# Patient Record
Sex: Female | Born: 1975 | Race: Black or African American | Hispanic: No | Marital: Single | Smoking: Never smoker
Health system: Southern US, Community
[De-identification: ages and names within clinical notes are randomized; demographics above are authoritative.]

## PROBLEM LIST (undated history)

## (undated) DIAGNOSIS — K5900 Constipation, unspecified: Secondary | ICD-10-CM

## (undated) DIAGNOSIS — K121 Other forms of stomatitis: Secondary | ICD-10-CM

## (undated) HISTORY — PX: TUBAL LIGATION: SHX77

## (undated) HISTORY — PX: ECTOPIC PREGNANCY SURGERY: SHX613

---

## 2012-08-11 ENCOUNTER — Emergency Department (HOSPITAL_COMMUNITY): Payer: Medicaid Other

## 2012-08-11 ENCOUNTER — Encounter (HOSPITAL_COMMUNITY): Admission: AD | Disposition: A | Discharge status: Home / Self Care | Payer: Self-pay | Source: Ambulatory Visit

## 2012-08-11 ENCOUNTER — Inpatient Hospital Stay: Admit: 2012-08-11 | Payer: Self-pay | Admitting: Obstetrics & Gynecology

## 2012-08-11 ENCOUNTER — Observation Stay (HOSPITAL_COMMUNITY)
Admission: AD | Admit: 2012-08-11 | Discharge: 2012-08-12 | Disposition: A | Discharge status: Home / Self Care | Payer: Medicaid Other | Source: Ambulatory Visit | Attending: Obstetrics & Gynecology | Admitting: Obstetrics & Gynecology

## 2012-08-11 ENCOUNTER — Inpatient Hospital Stay (HOSPITAL_COMMUNITY): Payer: Medicaid Other | Admitting: Anesthesiology

## 2012-08-11 ENCOUNTER — Encounter (HOSPITAL_COMMUNITY): Payer: Self-pay | Admitting: Anesthesiology

## 2012-08-11 DIAGNOSIS — R1032 Left lower quadrant pain: Secondary | ICD-10-CM | POA: Insufficient documentation

## 2012-08-11 DIAGNOSIS — K661 Hemoperitoneum: Secondary | ICD-10-CM | POA: Insufficient documentation

## 2012-08-11 DIAGNOSIS — O00109 Unspecified tubal pregnancy without intrauterine pregnancy: Principal | ICD-10-CM | POA: Insufficient documentation

## 2012-08-11 DIAGNOSIS — K5900 Constipation, unspecified: Secondary | ICD-10-CM

## 2012-08-11 DIAGNOSIS — K625 Hemorrhage of anus and rectum: Secondary | ICD-10-CM | POA: Insufficient documentation

## 2012-08-11 DIAGNOSIS — O009 Unspecified ectopic pregnancy without intrauterine pregnancy: Secondary | ICD-10-CM

## 2012-08-11 DIAGNOSIS — K649 Unspecified hemorrhoids: Secondary | ICD-10-CM

## 2012-08-11 HISTORY — DX: Constipation, unspecified: K59.00

## 2012-08-11 HISTORY — PX: LAPAROSCOPIC UNILATERAL SALPINGECTOMY: SHX5934

## 2012-08-11 HISTORY — DX: Other forms of stomatitis: K12.1

## 2012-08-11 HISTORY — PX: LAPAROSCOPIC LYSIS OF ADHESIONS: SHX5905

## 2012-08-11 HISTORY — PX: LAPAROSCOPY: SHX197

## 2012-08-11 LAB — CBC
HCT: 25.4 % — ABNORMAL LOW (ref 36.0–46.0)
Hemoglobin: 8.2 g/dL — ABNORMAL LOW (ref 12.0–15.0)
MCH: 26.3 pg (ref 26.0–34.0)
MCH: 26.1 pg (ref 26.0–34.0)
Platelets: 280 10*3/uL (ref 150–400)
RBC: 3 MIL/uL — ABNORMAL LOW (ref 3.87–5.11)
RBC: 3.14 MIL/uL — ABNORMAL LOW (ref 3.87–5.11)
WBC: 10 10*3/uL (ref 4.0–10.5)

## 2012-08-11 LAB — POCT I-STAT, CHEM 8
BUN: 10 mg/dL (ref 6–23)
Creatinine, Ser: 0.6 mg/dL (ref 0.50–1.10)
Glucose, Bld: 100 mg/dL — ABNORMAL HIGH (ref 70–99)
Hemoglobin: 10.9 g/dL — ABNORMAL LOW (ref 12.0–15.0)
TCO2: 21 mmol/L (ref 0–100)

## 2012-08-11 LAB — CBC WITH DIFFERENTIAL/PLATELET
Eosinophils Absolute: 0.2 10*3/uL (ref 0.0–0.7)
Eosinophils Relative: 3 % (ref 0–5)
HCT: 29.4 % — ABNORMAL LOW (ref 36.0–46.0)
Hemoglobin: 9.2 g/dL — ABNORMAL LOW (ref 12.0–15.0)
Lymphocytes Relative: 51 % — ABNORMAL HIGH (ref 12–46)
Lymphs Abs: 2.5 10*3/uL (ref 0.7–4.0)
MCH: 25.2 pg — ABNORMAL LOW (ref 26.0–34.0)
MCV: 80.5 fL (ref 78.0–100.0)
Monocytes Absolute: 0.3 10*3/uL (ref 0.1–1.0)
Monocytes Relative: 5 % (ref 3–12)
Platelets: 342 10*3/uL (ref 150–400)
RBC: 3.65 MIL/uL — ABNORMAL LOW (ref 3.87–5.11)

## 2012-08-11 LAB — TYPE AND SCREEN
ABO/RH(D): O POS
Antibody Screen: NEGATIVE

## 2012-08-11 SURGERY — LAPAROSCOPY OPERATIVE
Anesthesia: General | Wound class: Clean Contaminated

## 2012-08-11 MED ORDER — MEPERIDINE HCL 25 MG/ML IJ SOLN
6.2500 mg | INTRAMUSCULAR | Status: DC | PRN
  Administered 2012-08-11 (×2): 6.25 mg via INTRAVENOUS

## 2012-08-11 MED ORDER — MENTHOL 3 MG MT LOZG
1.0000 | LOZENGE | OROMUCOSAL | Status: DC | PRN
  Filled 2012-08-11: qty 9

## 2012-08-11 MED ORDER — SODIUM CHLORIDE 0.9 % IJ SOLN
INTRAMUSCULAR | Status: DC | PRN
  Administered 2012-08-11: 3 mL via INTRAVENOUS

## 2012-08-11 MED ORDER — NEOSTIGMINE METHYLSULFATE 1 MG/ML IJ SOLN
INTRAMUSCULAR | Status: DC | PRN
  Administered 2012-08-11: 4 mg via INTRAVENOUS

## 2012-08-11 MED ORDER — FAMOTIDINE IN NACL 20-0.9 MG/50ML-% IV SOLN
INTRAVENOUS | Status: AC
  Filled 2012-08-11: qty 50

## 2012-08-11 MED ORDER — LIDOCAINE HCL (CARDIAC) 20 MG/ML IV SOLN
INTRAVENOUS | Status: DC | PRN
  Administered 2012-08-11 (×2): 50 mg via INTRAVENOUS

## 2012-08-11 MED ORDER — BUPIVACAINE HCL (PF) 0.25 % IJ SOLN
INTRAMUSCULAR | Status: DC | PRN
  Administered 2012-08-11: 10 mL

## 2012-08-11 MED ORDER — FENTANYL CITRATE 0.05 MG/ML IJ SOLN
25.0000 ug | INTRAMUSCULAR | Status: DC | PRN
  Administered 2012-08-11 (×3): 50 ug via INTRAVENOUS

## 2012-08-11 MED ORDER — PHENYLEPHRINE HCL 10 MG/ML IJ SOLN
INTRAMUSCULAR | Status: DC | PRN
  Administered 2012-08-11 (×4): 40 ug via INTRAVENOUS

## 2012-08-11 MED ORDER — ONDANSETRON HCL 4 MG/2ML IJ SOLN
4.0000 mg | Freq: Four times a day (QID) | INTRAMUSCULAR | Status: DC | PRN
  Administered 2012-08-11: 4 mg via INTRAVENOUS
  Filled 2012-08-11: qty 2

## 2012-08-11 MED ORDER — PROPOFOL 10 MG/ML IV EMUL
INTRAVENOUS | Status: DC | PRN
  Administered 2012-08-11: 160 mg via INTRAVENOUS

## 2012-08-11 MED ORDER — GLYCOPYRROLATE 0.2 MG/ML IJ SOLN
INTRAMUSCULAR | Status: DC | PRN
  Administered 2012-08-11: .6 mg via INTRAVENOUS

## 2012-08-11 MED ORDER — LACTATED RINGERS IV SOLN
INTRAVENOUS | Status: DC
  Administered 2012-08-11: 22:00:00 via INTRAVENOUS

## 2012-08-11 MED ORDER — OXYCODONE-ACETAMINOPHEN 5-325 MG PO TABS
1.0000 | ORAL_TABLET | ORAL | Status: DC | PRN
  Administered 2012-08-11 – 2012-08-12 (×4): 1 via ORAL
  Filled 2012-08-11 (×4): qty 1

## 2012-08-11 MED ORDER — KETOROLAC TROMETHAMINE 30 MG/ML IJ SOLN
INTRAMUSCULAR | Status: DC | PRN
  Administered 2012-08-11: 30 mg via INTRAVENOUS

## 2012-08-11 MED ORDER — HYDROMORPHONE HCL PF 1 MG/ML IJ SOLN
1.0000 mg | Freq: Once | INTRAMUSCULAR | Status: AC
  Administered 2012-08-11: 1 mg via INTRAVENOUS
  Filled 2012-08-11: qty 1

## 2012-08-11 MED ORDER — FENTANYL CITRATE 0.05 MG/ML IJ SOLN
50.0000 ug | Freq: Once | INTRAMUSCULAR | Status: AC
  Administered 2012-08-11: 50 ug via INTRAVENOUS
  Filled 2012-08-11: qty 2

## 2012-08-11 MED ORDER — KETOROLAC TROMETHAMINE 30 MG/ML IJ SOLN: 30.0000 mg | Freq: Four times a day (QID) | INTRAMUSCULAR | Status: DC

## 2012-08-11 MED ORDER — ACETAMINOPHEN 325 MG PO TABS: 650.0000 mg | ORAL_TABLET | ORAL | Status: DC | PRN

## 2012-08-11 MED ORDER — ONDANSETRON HCL 4 MG/2ML IJ SOLN: 4.0000 mg | Freq: Once | INTRAMUSCULAR | Status: DC | PRN

## 2012-08-11 MED ORDER — FENTANYL CITRATE 0.05 MG/ML IJ SOLN
INTRAMUSCULAR | Status: AC
  Filled 2012-08-11: qty 2

## 2012-08-11 MED ORDER — KETOROLAC TROMETHAMINE 30 MG/ML IJ SOLN: 15.0000 mg | Freq: Once | INTRAMUSCULAR | Status: DC | PRN

## 2012-08-11 MED ORDER — DEXAMETHASONE SODIUM PHOSPHATE 10 MG/ML IJ SOLN
INTRAMUSCULAR | Status: DC | PRN
  Administered 2012-08-11: 10 mg via INTRAVENOUS

## 2012-08-11 MED ORDER — KETOROLAC TROMETHAMINE 30 MG/ML IJ SOLN
30.0000 mg | Freq: Four times a day (QID) | INTRAMUSCULAR | Status: DC
  Administered 2012-08-12 (×3): 30 mg via INTRAVENOUS
  Filled 2012-08-11 (×3): qty 1

## 2012-08-11 MED ORDER — ONDANSETRON HCL 4 MG/2ML IJ SOLN
INTRAMUSCULAR | Status: DC | PRN
  Administered 2012-08-11: 4 mg via INTRAVENOUS

## 2012-08-11 MED ORDER — ROCURONIUM BROMIDE 100 MG/10ML IV SOLN
INTRAVENOUS | Status: DC | PRN
  Administered 2012-08-11: 30 mg via INTRAVENOUS
  Administered 2012-08-11: 10 mg via INTRAVENOUS

## 2012-08-11 MED ORDER — ONDANSETRON HCL 4 MG PO TABS: 4.0000 mg | ORAL_TABLET | Freq: Four times a day (QID) | ORAL | Status: DC | PRN

## 2012-08-11 MED ORDER — MIDAZOLAM HCL 5 MG/5ML IJ SOLN
INTRAMUSCULAR | Status: DC | PRN
  Administered 2012-08-11: 2 mg via INTRAVENOUS

## 2012-08-11 MED ORDER — CITRIC ACID-SODIUM CITRATE 334-500 MG/5ML PO SOLN
ORAL | Status: AC
  Filled 2012-08-11: qty 15

## 2012-08-11 MED ORDER — POLYETHYLENE GLYCOL 3350 17 G PO PACK
17.0000 g | PACK | Freq: Two times a day (BID) | ORAL | Status: DC
  Administered 2012-08-12: 17 g via ORAL
  Filled 2012-08-11 (×3): qty 1

## 2012-08-11 MED ORDER — CEFAZOLIN SODIUM-DEXTROSE 2-3 GM-% IV SOLR
2.0000 g | Freq: Once | INTRAVENOUS | Status: AC
  Administered 2012-08-11: 2 g via INTRAVENOUS

## 2012-08-11 MED ORDER — LACTATED RINGERS IV SOLN
INTRAVENOUS | Status: DC | PRN
  Administered 2012-08-11 (×3): via INTRAVENOUS

## 2012-08-11 MED ORDER — LACTATED RINGERS IR SOLN
Status: DC | PRN
  Administered 2012-08-11: 3000 mL

## 2012-08-11 MED ORDER — FAMOTIDINE IN NACL 20-0.9 MG/50ML-% IV SOLN
20.0000 mg | Freq: Once | INTRAVENOUS | Status: AC
  Administered 2012-08-11: 20 mg via INTRAVENOUS

## 2012-08-11 MED ORDER — CITRIC ACID-SODIUM CITRATE 334-500 MG/5ML PO SOLN
30.0000 mL | Freq: Once | ORAL | Status: AC
  Administered 2012-08-11: 30 mL via ORAL

## 2012-08-11 MED ORDER — FENTANYL CITRATE 0.05 MG/ML IJ SOLN
INTRAMUSCULAR | Status: DC | PRN
  Administered 2012-08-11 (×2): 100 ug via INTRAVENOUS

## 2012-08-11 SURGICAL SUPPLY — 30 items
APPLICATOR COTTON TIP 6IN STRL (MISCELLANEOUS) ×3 IMPLANT
BLADE SURG 15 STRL LF C SS BP (BLADE) ×2 IMPLANT
BLADE SURG 15 STRL SS (BLADE) ×1
CHLORAPREP W/TINT 26ML (MISCELLANEOUS) ×3 IMPLANT
CLOTH BEACON ORANGE TIMEOUT ST (SAFETY) ×3 IMPLANT
DERMABOND ADVANCED (GAUZE/BANDAGES/DRESSINGS) ×1
DERMABOND ADVANCED .7 DNX12 (GAUZE/BANDAGES/DRESSINGS) ×2 IMPLANT
GLOVE BIO SURGEON STRL SZ7 (GLOVE) ×3 IMPLANT
GLOVE BIOGEL PI IND STRL 7.0 (GLOVE) ×2 IMPLANT
GLOVE BIOGEL PI INDICATOR 7.0 (GLOVE) ×1
GOWN PREVENTION PLUS LG XLONG (DISPOSABLE) ×6 IMPLANT
HEMOSTAT SURGICEL 4X8 (HEMOSTASIS) ×3 IMPLANT
NEEDLE INSUFFLATION 120MM (ENDOMECHANICALS) IMPLANT
NEEDLE INSUFFLATION 14GA 120MM (NEEDLE) ×3 IMPLANT
NS IRRIG 1000ML POUR BTL (IV SOLUTION) ×3 IMPLANT
PACK LAPAROSCOPY BASIN (CUSTOM PROCEDURE TRAY) ×3 IMPLANT
POUCH SPECIMEN RETRIEVAL 10MM (ENDOMECHANICALS) ×6 IMPLANT
PROTECTOR NERVE ULNAR (MISCELLANEOUS) ×3 IMPLANT
SCALPEL HARMONIC ACE (MISCELLANEOUS) ×3 IMPLANT
SET IRRIG TUBING LAPAROSCOPIC (IRRIGATION / IRRIGATOR) ×3 IMPLANT
SUT VICRYL 0 ENDOLOOP (SUTURE) IMPLANT
SUT VICRYL 0 UR6 27IN ABS (SUTURE) ×9 IMPLANT
SUT VICRYL 4-0 PS2 18IN ABS (SUTURE) ×9 IMPLANT
TOWEL OR 17X24 6PK STRL BLUE (TOWEL DISPOSABLE) ×6 IMPLANT
TRAY FOLEY CATH 14FR (SET/KITS/TRAYS/PACK) ×3 IMPLANT
TROCAR BALLN 12MMX100 BLUNT (TROCAR) IMPLANT
TROCAR XCEL DIL TIP R 11M (ENDOMECHANICALS) ×3 IMPLANT
TROCAR XCEL NON-BLD 11X100MML (ENDOMECHANICALS) ×6 IMPLANT
TROCAR XCEL NON-BLD 5MMX100MML (ENDOMECHANICALS) ×9 IMPLANT
WATER STERILE IRR 1000ML POUR (IV SOLUTION) ×3 IMPLANT

## 2012-08-11 NOTE — Anesthesia Preprocedure Evaluation (Addendum)
Anesthesia Evaluation  Patient identified by MRN, date of birth, ID band Patient awake    Reviewed: Allergy & Precautions, H&P , NPO status , Patient's Chart, lab work & pertinent test results, reviewed documented beta blocker date and time   Airway Mallampati: II TM Distance: >3 FB Neck ROM: full    Dental no notable dental hx. (+) Teeth Intact   Pulmonary neg pulmonary ROS,    Pulmonary exam normal       Cardiovascular negative cardio ROS      Neuro/Psych negative neurological ROS  negative psych ROS   GI/Hepatic negative GI ROS, Neg liver ROS,   Endo/Other  negative endocrine ROS  Renal/GU negative Renal ROS  negative genitourinary   Musculoskeletal negative musculoskeletal ROS (+)   Abdominal Normal abdominal exam  (+)   Peds negative pediatric ROS (+)  Hematology negative hematology ROS (+)   Anesthesia Other Findings   Reproductive/Obstetrics negative OB ROS                          Anesthesia Physical Anesthesia Plan  ASA: II  Anesthesia Plan: General   Post-op Pain Management:    Induction: Intravenous  Airway Management Planned: Oral ETT  Additional Equipment:   Intra-op Plan:   Post-operative Plan: Extubation in OR  Informed Consent: I have reviewed the patients History and Physical, chart, labs and discussed the procedure including the risks, benefits and alternatives for the proposed anesthesia with the patient or authorized representative who has indicated his/her understanding and acceptance.   Dental Advisory Given  Plan Discussed with: CRNA and Surgeon  Anesthesia Plan Comments:         Anesthesia Quick Evaluation

## 2012-08-11 NOTE — ED Provider Notes (Signed)
History     CSN: [id number]  Arrival date & time 08/11/12  1319   First MD Initiated Contact with Patient 08/11/12 1340      Chief Complaint  Patient presents with  . Ectopic Pregnancy    (Consider location/radiation/quality/duration/timing/severity/associated sxs/prior treatment) Patient is a 37 y.o. female presenting with abdominal pain. The history is provided by the patient.  Abdominal Pain Pain location:  LLQ Pain quality: sharp, shooting and stabbing   Pain radiates to:  Does not radiate Pain severity:  Severe Onset quality:  Sudden Duration:  1 hour Timing:  Constant Progression:  Unchanged Chronicity:  New Context comment:  States last week was in the hospital for ectopic pregnancy and given MTX x2 and has been constipated since d/c.  while using the bathroom today she developed severe LLQ pain which is same side as ectopic.   Relieved by:  Nothing Worsened by:  Palpation Ineffective treatments:  None tried Associated symptoms: constipation   Associated symptoms: no anorexia, no fever, no vaginal bleeding, no vaginal discharge and no vomiting   Risk factors: pregnancy   Risk factors comment:  Prior hx of right ectopic with removal of right fallopian tube.  discovered left ectopic last week adn getting mtx.   No past medical history on file.  No past surgical history on file.  No family history on file.  History  Substance Use Topics  . Smoking status: Not on file  . Smokeless tobacco: Not on file  . Alcohol Use: Not on file    OB History   No data available      Review of Systems  Constitutional: Negative for fever.  Gastrointestinal: Positive for abdominal pain and constipation. Negative for vomiting and anorexia.  Genitourinary: Negative for vaginal bleeding and vaginal discharge.  All other systems reviewed and are negative.    Allergies  Reglan  Home Medications  No current outpatient prescriptions on file.  BP 140/84  Pulse 117   Temp(Src) 97.9 F (36.6 C) (Oral)  Resp 18  SpO2 100%  Physical Exam  Nursing note and vitals reviewed. Constitutional: She is oriented to person, place, and time. She appears well-developed and well-nourished. No distress.  HENT:  Head: Normocephalic and atraumatic.  Mouth/Throat: Oropharynx is clear and moist.  Eyes: Conjunctivae and EOM are normal. Pupils are equal, round, and reactive to light.  Neck: Normal range of motion. Neck supple.  Cardiovascular: Normal rate, regular rhythm and intact distal pulses.   No murmur heard. Pulmonary/Chest: Effort normal and breath sounds normal. No respiratory distress. She has no wheezes. She has no rales.  Abdominal: Soft. She exhibits no distension. There is tenderness in the left lower quadrant. There is guarding. There is no rebound.  Genitourinary: Vagina normal and uterus normal. Rectal exam shows external hemorrhoid and tenderness. Rectal exam shows no mass. Cervix exhibits motion tenderness. Left adnexum displays tenderness and fullness.  No fecal impaction  Musculoskeletal: Normal range of motion. She exhibits no edema and no tenderness.  Neurological: She is alert and oriented to person, place, and time.  Skin: Skin is warm and dry. No rash noted. No erythema.  Psychiatric: She has a normal mood and affect. Her behavior is normal.    ED Course  Procedures (including critical care time)  Labs Reviewed  HCG, QUANTITATIVE, PREGNANCY - Abnormal; Notable for the following:    hCG, Beta Chain, Quant, S 7095 (*)    All other components within normal limits  CBC WITH DIFFERENTIAL - Abnormal; Notable  for the following:    RBC 3.65 (*)    Hemoglobin 9.2 (*)    HCT 29.4 (*)    MCH 25.2 (*)    RDW 17.8 (*)    Neutrophils Relative 41 (*)    Lymphocytes Relative 51 (*)    All other components within normal limits  POCT I-STAT, CHEM 8 - Abnormal; Notable for the following:    Glucose, Bld 100 (*)    Hemoglobin 10.9 (*)    HCT 32.0 (*)     All other components within normal limits   US Ob Comp Less 14 Wks  08/11/2012  *RADIOLOGY REPORT*  Clinical Data: Known left ectopic pregnancy in physician's office. Quantitative beta HCG is 7095.  OBSTETRIC <14 WK [place] AND TRANSVAGINAL OB US  Technique:  Both transabdominal and transvaginal ultrasound examinations were performed for complete evaluation of the gestation as well as the maternal uterus, adnexal regions, and pelvic cul-de-sac.  Transvaginal technique was performed to assess early pregnancy.  Comparison:  None.  Intrauterine gestational sac:  None  The endometrium is 11 mm in thickness.  Maternal uterus/adnexae: A left adnexal ectopic pregnancy is visualized.  The left adnexal mass measures approximately 2.1 x 1.8 x 2.4 cm and contains a gestational sac, yolk sac and fetal pole.  Faint cardiac activity was visualized real time by the sonographer, but a heart rate could not be measured by M-mode ultrasound.  The right ovary measures 4.0 x 2.9 x 2.2 cm and contains a 2.1 x 2.0 x 1.6 cm mildly complex cyst.  Prominent vessels are noted in the left adnexa.  A small amount of simple-appearing free fluid is seen.  IMPRESSION:  1.  Left adnexal ectopic pregnancy.  A left adnexal gestational sac, yolk sac, and fetal pole are visualized.  On real time examination, the sonographer visualizes faint fetal cardiac activity.  Findings were discussed by telephone with Dr. Anitra Lauth by Dr. Mayford Knife at 3:40 pm on 08/11/2012. 2.  Negative for intrauterine pregnancy. 3.  Right ovary contains a mildly complex 2.1 cm cyst.   Original Report Authenticated By: Britta Mccreedy, M.D.    US Ob Transvaginal  08/11/2012  *RADIOLOGY REPORT*  Clinical Data: Known left ectopic pregnancy in physician's office. Quantitative beta HCG is 7095.  OBSTETRIC <14 WK [place] AND TRANSVAGINAL OB US  Technique:  Both transabdominal and transvaginal ultrasound examinations were performed for complete evaluation of the gestation as well as the  maternal uterus, adnexal regions, and pelvic cul-de-sac.  Transvaginal technique was performed to assess early pregnancy.  Comparison:  None.  Intrauterine gestational sac:  None  The endometrium is 11 mm in thickness.  Maternal uterus/adnexae: A left adnexal ectopic pregnancy is visualized.  The left adnexal mass measures approximately 2.1 x 1.8 x 2.4 cm and contains a gestational sac, yolk sac and fetal pole.  Faint cardiac activity was visualized real time by the sonographer, but a heart rate could not be measured by M-mode ultrasound.  The right ovary measures 4.0 x 2.9 x 2.2 cm and contains a 2.1 x 2.0 x 1.6 cm mildly complex cyst.  Prominent vessels are noted in the left adnexa.  A small amount of simple-appearing free fluid is seen.  IMPRESSION:  1.  Left adnexal ectopic pregnancy.  A left adnexal gestational sac, yolk sac, and fetal pole are visualized.  On real time examination, the sonographer visualizes faint fetal cardiac activity.  Findings were discussed by telephone with Dr. Anitra Lauth by Dr. Mayford Knife at 3:40 pm on  08/11/2012. 2.  Negative for intrauterine pregnancy. 3.  Right ovary contains a mildly complex 2.1 cm cyst.   Original Report Authenticated By: Britta Mccreedy, M.D.    a   1. Ectopic pregnancy       MDM   Patient with a history of ectopic pregnancy that was discovered last week she was admitted to an outside hospital and received methotrexate x2. Last dose of methotrexate was 6 days ago. She states she was supposed to followup to get an hCG done on Saturday but did not. Today while she was attempting to use the bathroom she developed severe left lower quadrant pain.  On pelvic exam she has severe left adnexal tenderness. On rectal exam she has actively bleeding hemorrhoids with no stool in the vault and severe tenderness with palpation to the left side of the rectum with mild fullness. No evidence of fecal impaction at this time. Concern for possible ruptured ectopic pregnancy but a  normal blood pressure at this time. Hemoglobin of 9 without old to compare with a normal creatinine. HCG and pelvic ultrasound pending.  3:42 PM U/S without ruptured ectopic but GS and yolk sac present with cardiac flicker.  HCG today is 7000 and unknown prior.  4:10 PM Pt transferred to women's     Gwyneth Sprout, MD 08/11/12 1610

## 2012-08-11 NOTE — ED Notes (Signed)
Pt states she was hospitalized and discharged last Wednesday for ectopic pregnancy. Took shot, was told to come straight to ED if any pain in abd or back d/t possible rupture. Pt c/o severe LLQ abd pain that started 20 mins ago

## 2012-08-11 NOTE — Anesthesia Postprocedure Evaluation (Signed)
  Anesthesia Post-op Note  Anesthesia Post Note  Patient: Vickie Fields  Procedure(s) Performed: Procedure(s) (LRB): LAPAROSCOPY OPERATIVE (N/A) LAPAROSCOPIC UNILATERAL SALPINGECTOMY (Left) LAPAROSCOPIC LYSIS OF ADHESIONS (N/A)  Anesthesia type: General  Patient location: PACU  Post pain: Pain level controlled  Post assessment: Post-op Vital signs reviewed  Last Vitals:  Filed Vitals:   08/11/12 2045  BP:   Pulse:   Temp:   Resp: 16    Post vital signs: Reviewed  Level of consciousness: sedated  Complications: No apparent anesthesia complications

## 2012-08-11 NOTE — Transfer of Care (Signed)
Immediate Anesthesia Transfer of Care Note  Patient: Vickie Fields  Procedure(s) Performed: Procedure(s): LAPAROSCOPY OPERATIVE (N/A) LAPAROSCOPIC UNILATERAL SALPINGECTOMY (Left) LAPAROSCOPIC LYSIS OF ADHESIONS (N/A)  Patient Location: PACU  Anesthesia Type:General  Level of Consciousness: awake  Airway & Oxygen Therapy: Patient Spontanous Breathing  Post-op Assessment: Report given to PACU RN  Post vital signs: stable  Filed Vitals:   08/11/12 1718  BP:   Pulse:   Temp: 37.1 C  Resp:     Complications: No apparent anesthesia complications

## 2012-08-11 NOTE — Progress Notes (Addendum)
Faculty Practice OB/GYN Attending Note Received phone call from Vickie Fields regarding Vickie Fields who is a 37 y.o. Para 2 that presented with acute onset of LLQ pain and has a known 7 week viable left adnexal ectopic pregnancy.  Of note, she has a history of right sided ectopic pregnancy and underwent right salpingectomy.  She was treated at [facility] with methotrexate x 2 for this ectopic pregnancy, she is now six days after her second dose of methotrexate.  HCG today is 7095.  On exam, she had severe LLQ tenderness but had stable vital signs.  Hgb 9.2.  She is hemodynamically stable. Her studies are below:  Results for orders placed during the hospital encounter of 08/11/12 (from the past 24 hour(s))  HCG, QUANTITATIVE, PREGNANCY     Status: Abnormal   Collection Time    08/11/12  1:57 PM      Result Value Range   hCG, Beta Chain, Quant, S 7095 (*) <5 mIU/mL  CBC WITH DIFFERENTIAL     Status: Abnormal   Collection Time    08/11/12  1:57 PM      Result Value Range   WBC 4.9  4.0 - 10.5 K/uL   RBC 3.65 (*) 3.87 - 5.11 MIL/uL   Hemoglobin 9.2 (*) 12.0 - 15.0 g/dL   HCT 16.1 (*) 09.6 - 04.5 %   MCV 80.5  78.0 - 100.0 fL   MCH 25.2 (*) 26.0 - 34.0 pg   MCHC 31.3  30.0 - 36.0 g/dL   RDW 40.9 (*) 81.1 - 91.4 %   Platelets 342  150 - 400 K/uL   Neutrophils Relative 41 (*) 43 - 77 %   Neutro Abs 2.0  1.7 - 7.7 K/uL   Lymphocytes Relative 51 (*) 12 - 46 %   Lymphs Abs 2.5  0.7 - 4.0 K/uL   Monocytes Relative 5  3 - 12 %   Monocytes Absolute 0.3  0.1 - 1.0 K/uL   Eosinophils Relative 3  0 - 5 %   Eosinophils Absolute 0.2  0.0 - 0.7 K/uL   Basophils Relative 0  0 - 1 %   Basophils Absolute 0.0  0.0 - 0.1 K/uL  POCT I-STAT, CHEM 8     Status: Abnormal   Collection Time    08/11/12  2:06 PM      Result Value Range   Sodium 140  135 - 145 mEq/L   Potassium 3.8  3.5 - 5.1 mEq/L   Chloride 109  96 - 112 mEq/L   BUN 10  6 - 23 mg/dL   Creatinine, Ser 7.82  0.50 - 1.10 mg/dL   Glucose,  Bld 956 (*) 70 - 99 mg/dL   Calcium, Ion 2.13  0.86 - 1.23 mmol/L   TCO2 21  0 - 100 mmol/L   Hemoglobin 10.9 (*) 12.0 - 15.0 g/dL   HCT 57.8 (*) 46.9 - 62.9 %   08/11/2012   OBSTETRIC <14 WK [place] AND TRANSVAGINAL OB US*  Clinical Data: Known left ectopic pregnancy in physician's office. Quantitative beta HCG is 7095.   Technique:  Both transabdominal and transvaginal ultrasound examinations were performed for complete evaluation of the gestation as well as the maternal uterus, adnexal regions, and pelvic cul-de-sac.  Transvaginal technique was performed to assess early pregnancy.  Comparison:  None.  Intrauterine gestational sac:  None  The endometrium is 11 mm in thickness.  Maternal uterus/adnexae: A left adnexal ectopic pregnancy is visualized.  The left adnexal  mass measures approximately 2.1 x 1.8 x 2.4 cm and contains a gestational sac, yolk sac and fetal pole.  Faint cardiac activity was visualized real time by the sonographer, but a heart rate could not be measured by M-mode ultrasound.  The right ovary measures 4.0 x 2.9 x 2.2 cm and contains a 2.1 x 2.0 x 1.6 cm mildly complex cyst.  Prominent vessels are noted in the left adnexa.  A small amount of simple-appearing free fluid is seen.  IMPRESSION:  1.  Left adnexal ectopic pregnancy.  A left adnexal gestational sac, yolk sac, and fetal pole are visualized.  On real time examination, the sonographer visualizes faint fetal cardiac activity.  Findings were discussed by telephone with Vickie Fields by Vickie Fields at 3:40 pm on 08/11/2012. 2.  Negative for intrauterine pregnancy. 3.  Right ovary contains a mildly complex 2.1 cm cyst.   Original Report Authenticated By: Vickie Fields, M.D.   Patient has been NPO since last night, had sip of water at 1200 today.  I instructed Vickie Fields to transfer patient to MAU for preoperative preparation; patient will undergo laparoscopic left salpingectomy.  She understands this will make her infertile; she told Dr.  Anitra Fields she is "done with childbearing".  MAU informed; OR and Anesthesiologist on call (Vickie Fields) informed.  Will get her ready for the OR and consent her when she arrives to our hospital.  Vickie Collins, MD, FACOG Attending Obstetrician & Gynecologist Faculty Practice, [facility]    Pt seen and examined.  Agree with above note with addendum below  History remarkable for bloody stools x 1 month, one bowel movement in 1 month, anal fissure.  Recently hospitalized for 5 days for conservative management of ectopic pregnancy (bowel issues were not addressed).  3 prior laparoscopies for BTL, tubal reanastomosis, and right salpingectomy for ectopic pregnancy,.   Physical exam is remarkable for  Ulcer on the left tonsil.   Chest--CTAB CV--RRR Abd--soft, NT, ND, no rebound, no guarding Pt also has large hemorrhoids that extrude on BM Ext--soft, NT.  37 yo female with live left ectopic pregnancy.  Completed childbearing.  Pt seen and examined.  Pt consented for Laparoscopy, left salpingectomy, and removal of ectopic pregnancy. Risks include but not limited to bleeding, infection, damage to intrabdominal organs, complications from anesthesia.  Pt has had 3 prior laparoscopies and this increase risk of abdominal organ damage.  Risk of laparotomy is also discussed.  Ancef 2 gm for tonsil ulcer and prevention of SSI. Pt understands she will not be able to conceive after this Needs CT with IV and po contrast and GI consult.  Will consider doing this on an outpatient basis.  Vickie Statzer H.    Abd

## 2012-08-11 NOTE — Op Note (Signed)
Vickie Fields PROCEDURE DATE: 08/11/2012  PREOPERATIVE DIAGNOSIS: Left ectopic pregnancy with fetal heart tones s/p 2 doses of methotrexate  POSTOPERATIVE DIAGNOSIS: Left fallopian tube ectopic pregnancy with small hemoperitoneum  PROCEDURE: Laparoscopic left salpingectomy and removal of ectopic pregnancy, lysis of adhesions  SURGEON:  Dr. Elsie Lincoln  ASSISTANT: Chilton Si   ANESTHESIOLOGIST: [place]  INDICATIONS: 37 y.o. [id number] with left ectopic pregnancy with faint fetal heart beat s/p 2 does of methotrexate at [facility] in [place]. On exam, she had stable vital signs and no acute abdomen. Patient was counseled regarding need for laparoscopic salpingectomy. Risks of surgery including bleeding which may require transfusion or reoperation, infection, injury to bowel or other surrounding organs, need for additional procedures including laparotomy and other postoperative/anesthesia complications were explained to patient.  Written informed consent was obtained.  The patient had already had a right salpingectomy for prior ectopic.  Pt is done with childbearing and understands that she will have no fallopian tubes after the surgery.  FINDINGS:  Small amount of hemoperitoneum estimated to be about 150 of blood and clots.  Dilated left fallopian tube containing ectopic gestation with adhesions involving bowel and left ovary. Small normal appearing uterus, absent right fallopian tube, rt ovary slightly enlarged with probably hemorrhagic cyst, left ovary involved in adhesions but of normal in size.  ANESTHESIA: General  ESTIMATED BLOOD LOSS: 150 ml  SPECIMENS: left fallopian tube containing ectopic gestation  COMPLICATIONS: None immediate  PROCEDURE IN DETAIL:  The patient was taken to the operating room where general anesthesia was administered and was found to be adequate.  She was placed in the dorsal lithotomy position, and was prepped and draped in a sterile manner.  A Foley  catheter was inserted into her bladder and attached to constant drainage and a uterine manipulator was then advanced into the uterus .  After an adequate timeout was performed, attention was then turned to the patient's abdomen where a 10-mm skin incision was made on the umbilical fold.  The Veress needle was carefully introduced into the peritoneal cavity at a 45-degree angle into the abdominal wall.  Intraperitoneal placement was confirmed by drop in intraabdominal pressure with insufflation of carbon dioxide gas.  Adequate pneumoperitoneum was obtained, and the 10-mm trocar and sleeve were then advanced without difficulty into the abdomen where intraabdominal placement was confirmed by the laparoscope. A survey of the patient's pelvis and abdomen revealed the findings as above. Three 5 mm ports were placed--2 on the left and one on the right.  The 10-mm Nezhat suction irrigator was then used to suction the hemoperitoneum and irrigate the pelvis.  Attention was then turned to the left fallopian tube.  There was a moderate amount of adhesions involving the fallopian tube, ovary, side wall and bowel.  Blunt and sharp dissection was used to free the fallopian tube.  The Harmonic scalpel was used to remove the fallopian tube from the ovary, uterus, and mesosalpinx.  Good hemostasis was noted with the aid of a bipolar.  The specimen was placed in an EndoCatch bag and removed from the abdomen intact.  A piece of surgicel was placed over the raw surfaces to ensure hemostasis.  The pneumoperitoneum was gradually released under visualization and hemostasis was still noted.  All instruments were removed from the abdomen.  The fascial incision of the 10-mm site were reapproximated with 0 Vicryl figure-of-eight stiches; and all skin incisions were closed with a 3-0 Vicryl subcuticular stitch or Dermabond. The patient tolerated the procedure well.  The uterine manipulator and foley were removed.  All instruments, needles, and  sponge counts were correct x 2. The patient was taken to the recovery room in stable condition.    Kellon Chalk H. 08/11/2012 7:32 PM

## 2012-08-11 NOTE — ED Notes (Signed)
Pt states she was using the bathroom, had sudden onset of RLQ pain. Has hemorrhoids, has not been able to move bowels for past few days. States today she finally moved bowels but pain became severe. Denies any vaginal bleeding

## 2012-08-12 ENCOUNTER — Encounter (HOSPITAL_COMMUNITY): Payer: Self-pay | Admitting: Radiology

## 2012-08-12 ENCOUNTER — Observation Stay (HOSPITAL_COMMUNITY): Payer: Medicaid Other

## 2012-08-12 DIAGNOSIS — K5900 Constipation, unspecified: Secondary | ICD-10-CM

## 2012-08-12 DIAGNOSIS — K649 Unspecified hemorrhoids: Secondary | ICD-10-CM

## 2012-08-12 LAB — CBC
HCT: 24.9 % — ABNORMAL LOW (ref 36.0–46.0)
RBC: 3.09 MIL/uL — ABNORMAL LOW (ref 3.87–5.11)
RDW: 17.9 % — ABNORMAL HIGH (ref 11.5–15.5)
WBC: 5.8 10*3/uL (ref 4.0–10.5)

## 2012-08-12 LAB — ABO/RH: ABO/RH(D): O POS

## 2012-08-12 MED ORDER — HYDROCORTISONE ACE-PRAMOXINE 1-1 % RE FOAM
1.0000 | Freq: Two times a day (BID) | RECTAL | Status: DC
  Administered 2012-08-12: 1 via RECTAL
  Filled 2012-08-12: qty 10

## 2012-08-12 MED ORDER — HYDROCORTISONE ACE-PRAMOXINE 1-1 % RE FOAM: 1.0000 | Freq: Two times a day (BID) | RECTAL | Status: AC

## 2012-08-12 MED ORDER — IOHEXOL 300 MG/ML  SOLN
100.0000 mL | Freq: Once | INTRAMUSCULAR | Status: AC | PRN
  Administered 2012-08-12: 100 mL via INTRAVENOUS

## 2012-08-12 MED ORDER — PHENOL 1.4 % MT LIQD
1.0000 | OROMUCOSAL | Status: DC | PRN
  Administered 2012-08-12: 1 via OROMUCOSAL
  Filled 2012-08-12: qty 177

## 2012-08-12 MED ORDER — OXYCODONE-ACETAMINOPHEN 5-325 MG PO TABS: 1.0000 | ORAL_TABLET | ORAL | Status: AC | PRN

## 2012-08-12 MED ORDER — POLYETHYLENE GLYCOL 3350 17 G PO PACK: 17.0000 g | PACK | Freq: Two times a day (BID) | ORAL | Status: AC

## 2012-08-12 MED ORDER — IOHEXOL 300 MG/ML  SOLN: 50.0000 mL | INTRAMUSCULAR | Status: AC

## 2012-08-12 MED ORDER — IBUPROFEN 600 MG PO TABS: 600.0000 mg | ORAL_TABLET | Freq: Four times a day (QID) | ORAL | Status: AC | PRN

## 2012-08-12 NOTE — Progress Notes (Signed)
Discharge instructions and teaching given to patient along with medication prescriptions. Patient verbalized understanding. Patient left with friend in a wheelchair and was secured in passenger seat of vehicle.

## 2012-08-12 NOTE — Progress Notes (Signed)
UR completed 

## 2012-08-12 NOTE — Progress Notes (Signed)
1 Day Post-Op Procedure(s) (LRB): LAPAROSCOPY OPERATIVE (N/A) LAPAROSCOPIC UNILATERAL SALPINGECTOMY (Left) LAPAROSCOPIC LYSIS OF ADHESIONS (N/A)  Subjective: Patient reports constipation, n/v (has been occurring for one month.    Objective: I have reviewed patient's vital signs, medications and labs. CBC    Component Value Date/Time   WBC 5.8 08/12/2012 0530   RBC 3.09* 08/12/2012 0530   HGB 7.9* 08/12/2012 0530   HCT 24.9* 08/12/2012 0530   PLT 285 08/12/2012 0530   MCV 80.6 08/12/2012 0530   MCH 25.6* 08/12/2012 0530   MCHC 31.7 08/12/2012 0530   RDW 17.9* 08/12/2012 0530   LYMPHSABS 2.5 08/11/2012 1357   MONOABS 0.3 08/11/2012 1357   EOSABS 0.2 08/11/2012 1357   BASOSABS 0.0 08/11/2012 1357     General: alert and cooperative GI: incision: clean, dry and intact and soft, nt, nd Extremities: extremities normal, atraumatic, no cyanosis or edema and Homans sign is negative, no sign of DVT Vaginal Bleeding: minimal  Assessment: s/p Procedure(s): LAPAROSCOPY OPERATIVE (N/A) LAPAROSCOPIC UNILATERAL SALPINGECTOMY (Left) LAPAROSCOPIC LYSIS OF ADHESIONS (N/A): stable Pt has n/v for one month, BRB per rectum, chronic constipation (one BM in a month)  Plan: Advance diet CT abd/pelvis with contrast GI consult today  LOS: 1 day    Vickie Fields H. 08/12/2012, 7:18 AM

## 2012-08-12 NOTE — Consult Note (Addendum)
Referring Provider: Dr. Thad Ranger Primary Care Physician:  No primary provider on file. Primary Gastroenterologist:  Gentry Fitz  Reason for Consultation:  Constipation;  Rectal bleeding; Constipation  HPI: Vickie Fields is a 37 y.o. female admitted for management of a left-sided ectopic pregancy (s/p MTX X 2 for it) who has had a change in her bowels over the past 4 weeks with slow stools and small amounts when she used to go daily without difficulty. During the slowdown in her stools she has been having recurrent bright red blood per rectum that she describes as "like a period" from her rectum. She shows me a picture of blood filling the toilet stating that came from her rectum and she shows me a picture of what looks like circumferential hemorrhoids that was taken of her anus. She has been having rectal pain and burning as well. She is s/p surgery today for a left-sided ectopic pregnancy and had adhesions present involving the bowel and ovary. She reports being in the hospital about a week ago (no records noted) for abdominal pain and being given MgCitrate, a suppository, Miralax without BMs and then being given Golytely which caused vomiting and no stools. She has not had a well-formed stool in several weeks and is having small amounts if any each day. She says her boyfriend has seen a "tear" on her hemorrhoids and she feels them bulge out when she has a BM and then they go back in. Been having LLQ abdominal pain and episodes of vomiting reported as well.     Past Medical History  Diagnosis Date  . Constipation   . Ulcer mouth     Past Surgical History  Procedure Laterality Date  . Ectopic pregnancy surgery    . Tubal ligation    . Laparoscopy N/A 08/11/2012    Procedure: LAPAROSCOPY OPERATIVE;  Surgeon: Lesly Dukes, MD;  Location: [facility];  Service: Gynecology;  Laterality: N/A;  . Laparoscopic unilateral salpingectomy Left 08/11/2012    Procedure: LAPAROSCOPIC UNILATERAL SALPINGECTOMY;   Surgeon: Lesly Dukes, MD;  Location: [facility];  Service: Gynecology;  Laterality: Left;  . Laparoscopic lysis of adhesions N/A 08/11/2012    Procedure: LAPAROSCOPIC LYSIS OF ADHESIONS;  Surgeon: Lesly Dukes, MD;  Location: [facility];  Service: Gynecology;  Laterality: N/A;    Prior to Admission medications   Not on File    Scheduled Meds: . ketorolac  30 mg Intravenous Q6H   Or  . ketorolac  30 mg Intramuscular Q6H  . polyethylene glycol  17 g Oral BID   Continuous Infusions: . lactated ringers 20 mL/hr at 08/11/12 2159   PRN Meds:.acetaminophen, menthol-cetylpyridinium, ondansetron (ZOFRAN) IV, ondansetron, oxyCODONE-acetaminophen  Allergies as of 08/11/2012 - Review Complete 08/11/2012  Allergen Reaction Noted  . Reglan (metoclopramide)  08/11/2012    History reviewed. No pertinent family history.  History   Social History  . Marital Status: Single    Spouse Name: N/A    Number of Children: N/A  . Years of Education: N/A   Occupational History  . Not on file.   Social History Main Topics  . Smoking status: Not on file  . Smokeless tobacco: Not on file  . Alcohol Use: No  . Drug Use: No  . Sexually Active: Yes   Other Topics Concern  . Not on file   Social History Narrative  . No narrative on file    Review of Systems: All negative from a GI standpoint except as stated above in HPI.  Physical Exam: Vital signs: Filed Vitals:   08/12/12 1200  BP: 100/66  Pulse: 82  Temp: 98 F (36.7 C)  Resp: 18   Last BM Date:  (Patient states has not had an adequate BM in a month) General:   Lethargic, Well-developed, well-nourished, pleasant and cooperative in NAD Rectal:  Small nonthrombosed external hemorrhoids; DRE tender with no thrombosed internal hemorrhoids noted, no fissure noted, no blood on gloved finger  GI:  Lab Results:  Recent Labs  08/11/12 2041 08/11/12 2245 08/12/12 0530  WBC 10.0 8.7 5.8  HGB 7.9* 8.2* 7.9*  HCT 24.3* 25.4* 24.9*   PLT 280 297 285   BMET  Recent Labs  08/11/12 1406  NA 140  K 3.8  CL 109  GLUCOSE 100*  BUN 10  CREATININE 0.60   LFT No results found for this basename: PROT, ALBUMIN, AST, ALT, ALKPHOS, BILITOT, BILIDIR, IBILI,  in the last 72 hours PT/INR No results found for this basename: LABPROT, INR,  in the last 72 hours   Studies/Results: US Ob Comp Less 14 Wks  08/11/2012  *RADIOLOGY REPORT*  Clinical Data: Known left ectopic pregnancy in physician's office. Quantitative beta HCG is 7095.  OBSTETRIC <14 WK [place] AND TRANSVAGINAL OB US  Technique:  Both transabdominal and transvaginal ultrasound examinations were performed for complete evaluation of the gestation as well as the maternal uterus, adnexal regions, and pelvic cul-de-sac.  Transvaginal technique was performed to assess early pregnancy.  Comparison:  None.  Intrauterine gestational sac:  None  The endometrium is 11 mm in thickness.  Maternal uterus/adnexae: A left adnexal ectopic pregnancy is visualized.  The left adnexal mass measures approximately 2.1 x 1.8 x 2.4 cm and contains a gestational sac, yolk sac and fetal pole.  Faint cardiac activity was visualized real time by the sonographer, but a heart rate could not be measured by M-mode ultrasound.  The right ovary measures 4.0 x 2.9 x 2.2 cm and contains a 2.1 x 2.0 x 1.6 cm mildly complex cyst.  Prominent vessels are noted in the left adnexa.  A small amount of simple-appearing free fluid is seen.  IMPRESSION:  1.  Left adnexal ectopic pregnancy.  A left adnexal gestational sac, yolk sac, and fetal pole are visualized.  On real time examination, the sonographer visualizes faint fetal cardiac activity.  Findings were discussed by telephone with Dr. Anitra Lauth by Dr. Mayford Knife at 3:40 pm on 08/11/2012. 2.  Negative for intrauterine pregnancy. 3.  Right ovary contains a mildly complex 2.1 cm cyst.   Original Report Authenticated By: Britta Mccreedy, M.D.    US Ob Transvaginal  08/11/2012   *RADIOLOGY REPORT*  Clinical Data: Known left ectopic pregnancy in physician's office. Quantitative beta HCG is 7095.  OBSTETRIC <14 WK [place] AND TRANSVAGINAL OB US  Technique:  Both transabdominal and transvaginal ultrasound examinations were performed for complete evaluation of the gestation as well as the maternal uterus, adnexal regions, and pelvic cul-de-sac.  Transvaginal technique was performed to assess early pregnancy.  Comparison:  None.  Intrauterine gestational sac:  None  The endometrium is 11 mm in thickness.  Maternal uterus/adnexae: A left adnexal ectopic pregnancy is visualized.  The left adnexal mass measures approximately 2.1 x 1.8 x 2.4 cm and contains a gestational sac, yolk sac and fetal pole.  Faint cardiac activity was visualized real time by the sonographer, but a heart rate could not be measured by M-mode ultrasound.  The right ovary measures 4.0 x 2.9 x 2.2 cm  and contains a 2.1 x 2.0 x 1.6 cm mildly complex cyst.  Prominent vessels are noted in the left adnexa.  A small amount of simple-appearing free fluid is seen.  IMPRESSION:  1.  Left adnexal ectopic pregnancy.  A left adnexal gestational sac, yolk sac, and fetal pole are visualized.  On real time examination, the sonographer visualizes faint fetal cardiac activity.  Findings were discussed by telephone with Dr. Anitra Lauth by Dr. Mayford Knife at 3:40 pm on 08/11/2012. 2.  Negative for intrauterine pregnancy. 3.  Right ovary contains a mildly complex 2.1 cm cyst.   Original Report Authenticated By: Britta Mccreedy, M.D.    Ct Abdomen Pelvis W Contrast  08/12/2012  *RADIOLOGY REPORT*  Clinical Data: Rectal bleeding  CT ABDOMEN AND PELVIS WITH CONTRAST  Technique:  Multidetector CT imaging of the abdomen and pelvis was performed following the standard protocol during bolus administration of intravenous contrast.  Contrast: OMNIPAQUE IOHEXOL 300 MG/ML  SOLN  Comparison: None  Findings: The lung bases are clear.  No pericardial or pleural  effusion identified.  There is no focal liver abnormality.  Focal area of low attenuation within the right hepatic lobe measures 7 mm, image 12/series 2. Within the anterior right hepatic lobe there is a 7 mm low attenuation structure, image 16/series 2.  The gallbladder appears normal.  No biliary dilatation.  Normal appearance of the pancreas.  The spleen appears normal.  Normal appearance of both adrenal glands.  Bilateral pelvocaliectasis is identified.  No evidence for high-grade obstructive uropathy.   Gas is noted within the urinary bladder. The uterus appears edematous.  No focal mass identified.  Cyst within the right ovary measures 1.7 cm.  Left adnexa is unremarkable.  The abdominal aorta has a normal caliber.  No aneurysm.  There is no adenopathy noted within the upper abdomen.  There is no pelvic or inguinal adenopathy identified.  The stomach appears within normal limits.  The small bowel loops have a normal caliber without evidence for obstruction.  There is a normal appearance of the colon.  There is a trace amount of free fluid noted within the dependent portion of the pelvis.  No focal fluid collections identified. Small foci of free intraperitoneal air are identified likely sequela of recent surgery.  IMPRESSION:  1.  There are no specific features identified to explain the patient's rectal bleeding. 2.  Gas within the urinary bladder likely reflects recent instrumentation. 3.  Free intraperitoneal air consistent with early postoperative period. 4.  Right ovarian cyst   Original Report Authenticated By: Signa Kell, M.D.     Impression/Plan: 37 yo with rectal bleeding in the setting of severe constipation for the last month and history of hemorrhoids. Tried to reassure her. Likely is having prolapse of her hemorrhoids as noted on the cellphone picture she showed me, which is likely all due to her constipation. Needs a better bowel habit regimen by taking Miralax QD - BID unless diarrhea  develops. Will start proctofoam to use daily. If bleeding persists despite a better bowel habit regimen, then will need to have her hemorrhoids evaluated by a surgeon for non-surgical techniques. I do not think she is having colitis as the source of her bleeding and do not feel that a colonoscopy is indicated at this time. I do not think she is having a rectal mucosal prolapse, but this ectopic pregnancy with adhesions involving bowel has led to her recent onset of constipation. F/U with GI as an outpt as needed.  Advised her to call if bleeding continues after discharge despite use of the Proctofoam and Miralax. Will sign off. Call us back if questions.    LOS: 1 day   Thetis Schwimmer C.  08/12/2012, 1:52 PM

## 2012-08-12 NOTE — Anesthesia Postprocedure Evaluation (Signed)
  Anesthesia Post-op Note  Patient: Shirlean Schlein  Procedure(s) Performed: Procedure(s): LAPAROSCOPY OPERATIVE (N/A) LAPAROSCOPIC UNILATERAL SALPINGECTOMY (Left) LAPAROSCOPIC LYSIS OF ADHESIONS (N/A)  Patient Location: [facility]  Anesthesia Type:General  Level of Consciousness: awake, alert  and oriented  Airway and Oxygen Therapy: Patient Spontanous Breathing  Post-op Pain: mild  Post-op Assessment: Post-op Vital signs reviewed, Patient's Cardiovascular Status Stable, Respiratory Function Stable, Patent Airway, NAUSEA AND VOMITING PRESENT and Pain level controlled spoke to RN - nausea d/t contrast dye for GI CT scan scheduled later today. Intake & output reported to be adequate by RN.  Post-op Vital Signs: Reviewed and stable  Complications: No apparent anesthesia complications

## 2012-08-12 NOTE — Discharge Summary (Signed)
  Gynecology Physician Discharge Summary  Patient ID: Vickie Fields MRN: [id number] DOB/AGE: 1975/04/26 36 y.o.  Admit date: 08/11/2012 Discharge date: 08/12/2012  Preoperative Diagnoses: Left ectopic pregnancy, constipation, rectal bleeding, emesis  Procedures: Procedure(s) (LRB): LAPAROSCOPY OPERATIVE (N/A) LAPAROSCOPIC UNILATERAL SALPINGECTOMY (Left) LAPAROSCOPIC LYSIS OF ADHESIONS (N/A)  Consults: Gasteroenterology  Significant Diagnostic Studies: Preoperative hemoglobin 9.2  Recent Labs Lab 08/11/12 2041 08/11/12 2245 08/12/12 0530  WBC 10.0 8.7 5.8  HGB 7.9* 8.2* 7.9*  HCT 24.3* 25.4* 24.9*  PLT 280 297 285     Hospital Course:  Jerolene Kupfer is a 37 y.o. [id number] admitted for left ectopic pregnancy with FHTs. She underwent laparoscopic left salpingectomy with lysis of adhesions with no complications. For further details about surgery, please refer to the operative report. Patient had an uncomplicated postoperative course. By time of discharge, her pain was controlled on oral pain medications; she was ambulating, voiding without difficulty, and tolerating regular diet. She was deemed stable for discharge to home.   She complained of one month of constipation and rectal bleeding prior to surgery, as well as projectile vomiting. Gastroenterology was consulted and felt the bleeding was from hemorrhoids. She was prescribed MiraLax and Proctofoam and may follow up as an outpatient as needed.   Discharge Exam: Blood pressure 100/66, pulse 82, temperature 98 F (36.7 C), temperature source Oral, resp. rate 18, height 5\' 6"  (1.676 m), weight 52.164 kg (115 lb), SpO2 99.00%.  GEN:  WNWD, no distress HEENT:  NCAT, EOMI, conjunctiva clear NECK:  Supple, non-tender, no thyromegaly, trachea midline CV: RRR, no murmur RESP:  CTAB ABD:  Soft, mild tenderness around incisions, no guarding or rebound, hyperactive bowel sounds; incisions clean/dry/intacrt EXTREM:  Warm, well  perfused, no edema or tenderness NEURO:  Alert, oriented, no focal deficits  Discharged Condition: stable  Disposition: 01-Home or Self Care  Discharge Orders   Future Orders Complete By Expires     Discharge patient  As directed     Comments:      To home        Medication List    TAKE these medications       hydrocortisone-pramoxine rectal foam  Commonly known as:  PROCTOFOAM-HC  Place 1 applicator rectally 2 (two) times daily.     ibuprofen 600 MG tablet  Commonly known as:  ADVIL,MOTRIN  Take 1 tablet (600 mg total) by mouth every 6 (six) hours as needed for pain.     oxyCODONE-acetaminophen 5-325 MG per tablet  Commonly known as:  PERCOCET/ROXICET  Take 1-2 tablets by mouth every 4 (four) hours as needed.     polyethylene glycol packet  Commonly known as:  MIRALAX / GLYCOLAX  Take 17 g by mouth 2 (two) times daily.           Follow-up Information   Follow up with [facility] In 2 weeks. (Post-operative follow up. You should receive a call with appointment time and date, but if you do not hear from the clinic, call the number above.)    Contact information:   [address] [place] [address] [phone]      Signed:  Napoleon Form, MD

## 2012-08-29 ENCOUNTER — Ambulatory Visit: Payer: Self-pay | Admitting: Obstetrics & Gynecology

## 2012-09-11 ENCOUNTER — Ambulatory Visit (INDEPENDENT_AMBULATORY_CARE_PROVIDER_SITE_OTHER): Payer: Medicaid Other | Admitting: Obstetrics & Gynecology

## 2012-09-11 ENCOUNTER — Encounter: Payer: Self-pay | Admitting: Obstetrics & Gynecology

## 2012-09-11 VITALS — BP 118/80 | HR 83 | Ht 66.0 in | Wt 116.2 lb

## 2012-09-11 DIAGNOSIS — K5900 Constipation, unspecified: Secondary | ICD-10-CM

## 2012-09-11 DIAGNOSIS — N898 Other specified noninflammatory disorders of vagina: Secondary | ICD-10-CM

## 2012-09-11 DIAGNOSIS — Z9079 Acquired absence of other genital organ(s): Secondary | ICD-10-CM | POA: Insufficient documentation

## 2012-09-11 DIAGNOSIS — Z09 Encounter for follow-up examination after completed treatment for conditions other than malignant neoplasm: Secondary | ICD-10-CM

## 2012-09-11 DIAGNOSIS — K649 Unspecified hemorrhoids: Secondary | ICD-10-CM

## 2012-09-11 NOTE — Progress Notes (Signed)
Pt reports that she has a stitch poking out of one of her incisions. Has stomach cramps. Also reports decreased appetite, and difficulty with having bowel movements.

## 2012-09-11 NOTE — Progress Notes (Signed)
GYNECOLOGY CLINIC PROGRESS NOTE  History:  37 y.o. [id number] here today for follow up after laparoscopic left salpingectomy and removal of ectopic pregnancy, lysis of adhesions on 08/11/12.  Shereports that she has a stitch poking out of one of her incisions. Also reports stomach cramps, decreased appetite, and difficulty with having bowel movements.  Has used stool softeners, laxatives and enemas.  Wants referral to GI as this is a chronic problem. Also has hemorrhoids that bleed occasionally; treated with proctofoam.  Not sexually active. Reports having itchy yellow discharge, wants evaluation.  The following portions of the patient's history were reviewed and updated as appropriate: allergies, current medications, past family history, past medical history, past social history, past surgical history and problem list.  Review of Systems:  Pertinent items are noted in HPI.  Objective:  Physical Exam BP 118/80  Pulse 83  Ht 5\' 6"  (1.676 m)  Wt 116 lb 3.2 oz (52.708 kg)  BMI 18.76 kg/m2  LMP 06/22/2012 Gen: NAD Abd: Soft, nontender and nondistended, well-healed laparoscopic incisions; suture knots excised Pelvic: Yellow mucousy discharge seen, no lesions, wet prep sample obtained.  Labs and Imaging Pathology 08/11/12 Fallopian tube, ectopic pregnancy, left:  HEMORRHAGIC FALLOPIAN TUBE AND CHRONIC VILLI CONSISTENT WITH TUBAL ECTOPIC PREGNANCY, CLINICALLY LEFT.  Assessment & Plan:  Stable postoperative exam; patient understands she is infertile Will follow up results of wet prep and manage accordingly GI referral will be made after this encounter Routine preventative health maintenance measures emphasized

## 2012-09-11 NOTE — Patient Instructions (Signed)
Return to clinic for any scheduled appointments or for any gynecologic concerns as needed.   

## 2012-09-12 LAB — WET PREP, GENITAL: Yeast Wet Prep HPF POC: NONE SEEN

## 2012-09-15 ENCOUNTER — Telehealth: Payer: Self-pay | Admitting: General Practice

## 2012-09-15 NOTE — Telephone Encounter (Signed)
Called patient, no answer and heard message stating mailbox is full.

## 2012-09-15 NOTE — Telephone Encounter (Signed)
Message copied by Kathee Delton on Mon Sep 15, 2012 11:49 AM ------      Message from: [name] A      Created: Fri Sep 12, 2012 11:08 AM       Negative wet prep. Please call to inform patient of results.       ------

## 2012-09-16 NOTE — Telephone Encounter (Signed)
Called patient, no answer mailbox is still full. No further attempts for contact. Result is negative.

## 2014-02-01 ENCOUNTER — Encounter: Payer: Self-pay | Admitting: Obstetrics & Gynecology

## 2014-04-28 IMAGING — CT CT ABD-PELV W/ CM
1 of 2 series · 15 of 32 positions shown, 19 images · IV contrast (OMNIPAQUE)
Comparison: None

CLINICAL DATA: Rectal bleeding

CT ABDOMEN AND PELVIS WITH CONTRAST
TECHNIQUE: Multidetector CT imaging of the abdomen and pelvis was
performed following the standard protocol during bolus
administration of intravenous contrast.
Contrast: 100mL OMNIPAQUE IOHEXOL 300 MG/ML  SOLN

[Series 2: routine abdomen/pelvis with · axial · 0.67mm/px · z∈[+520,+870]mm · 15 of 78 slices shown, 19 images]
[im 4/78  soft-tissue]
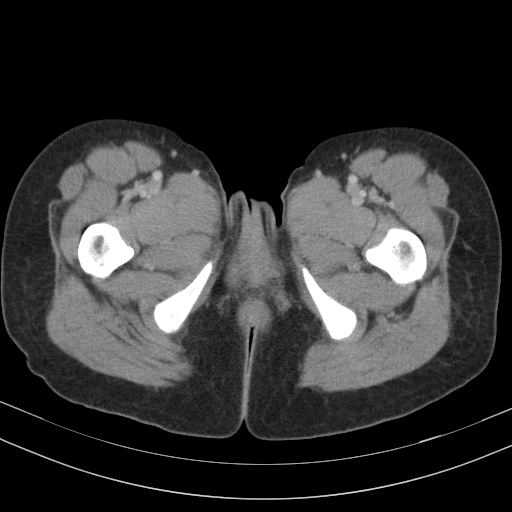
[im 4/78  bone]
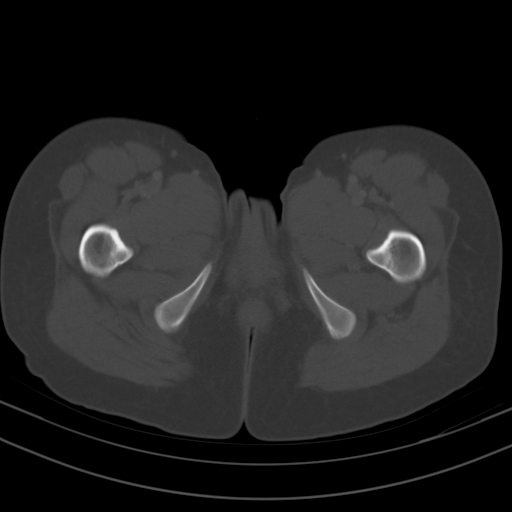
[im 10/78  soft-tissue]
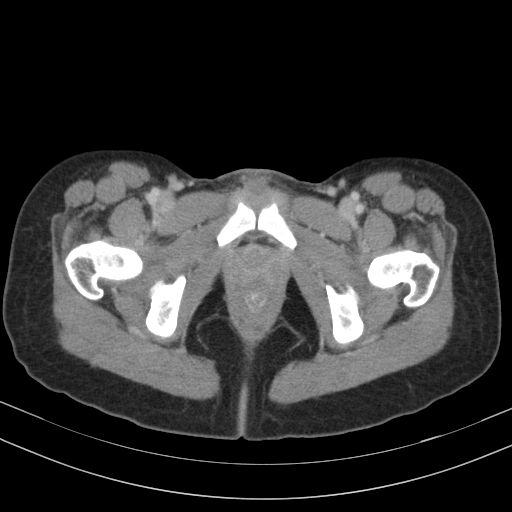
[im 16/78  soft-tissue]
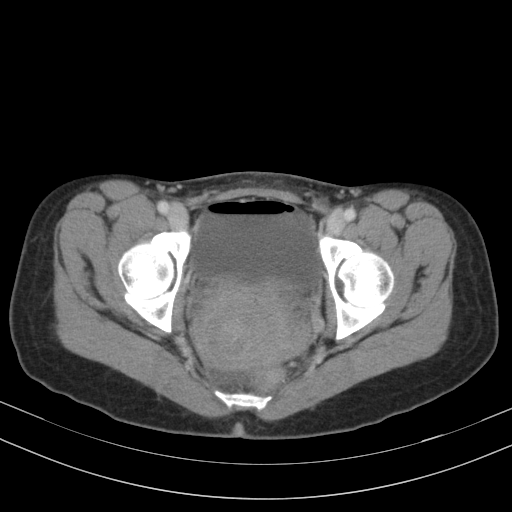
[im 22/78  soft-tissue]
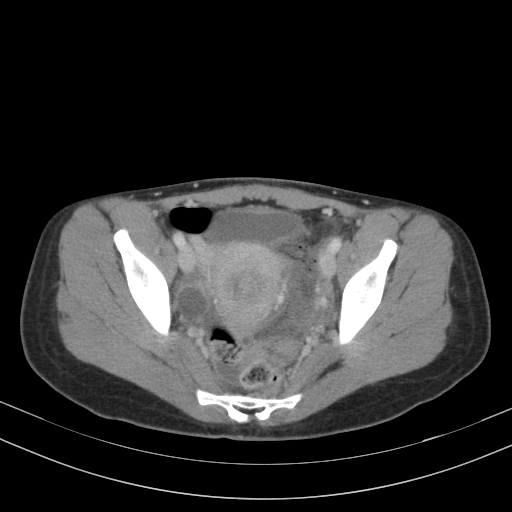
[im 28/78  soft-tissue]
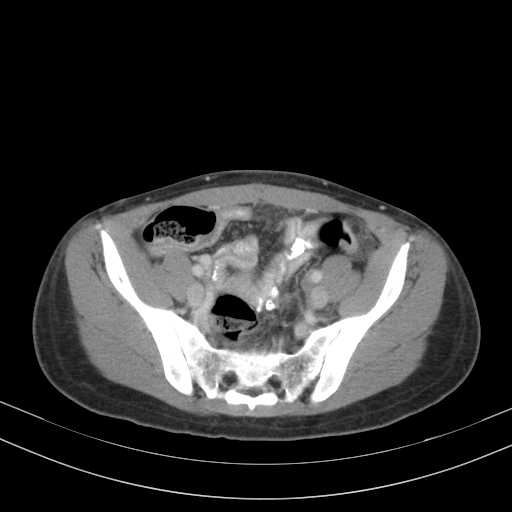
[im 34/78  soft-tissue]
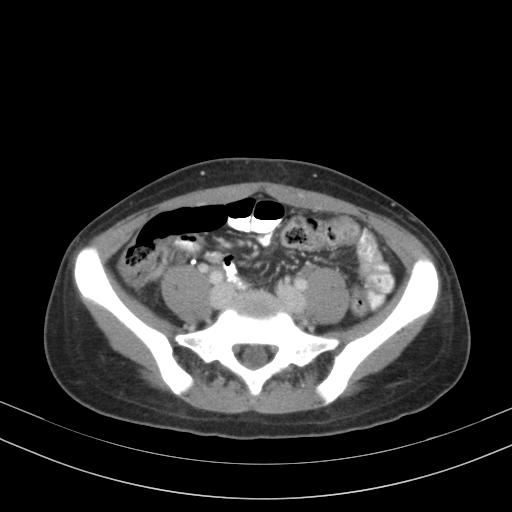
[im 41/78  soft-tissue]
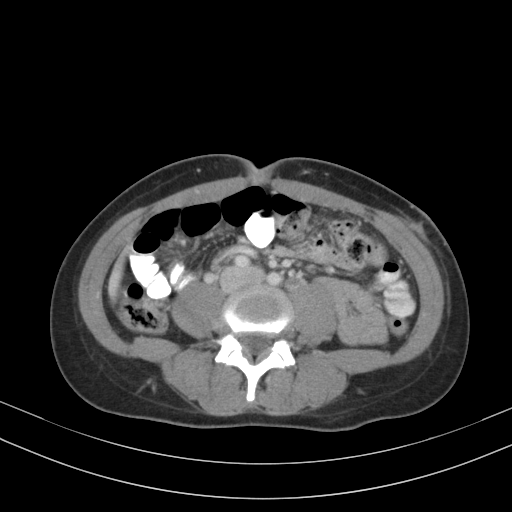
[im 44/78  soft-tissue]
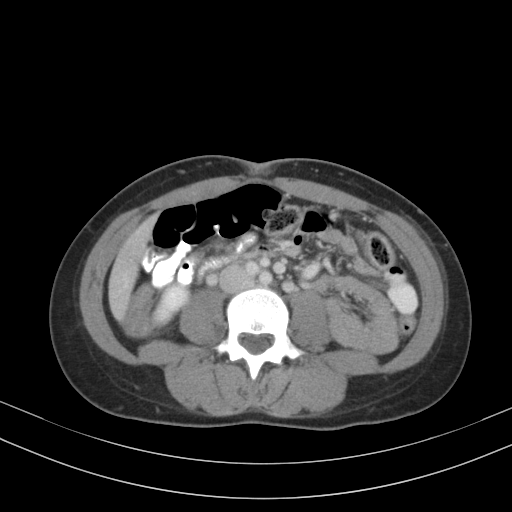
[im 50/78  soft-tissue]
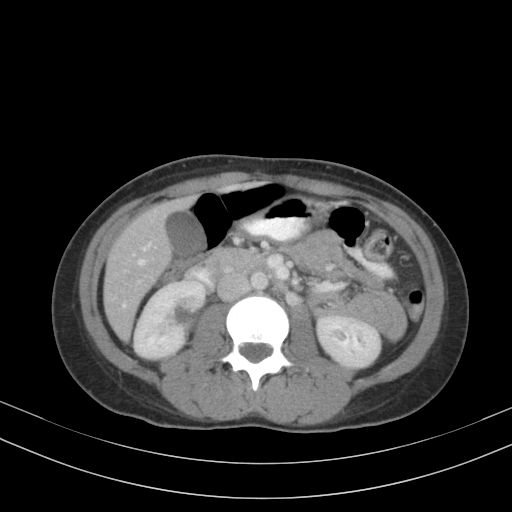
[im 50/78  bone]
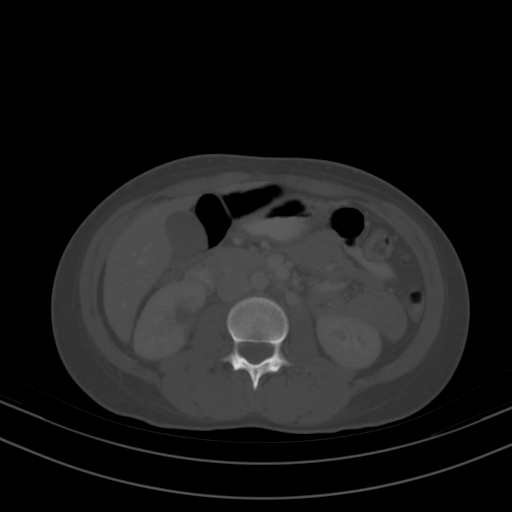
[im 56/78  soft-tissue]
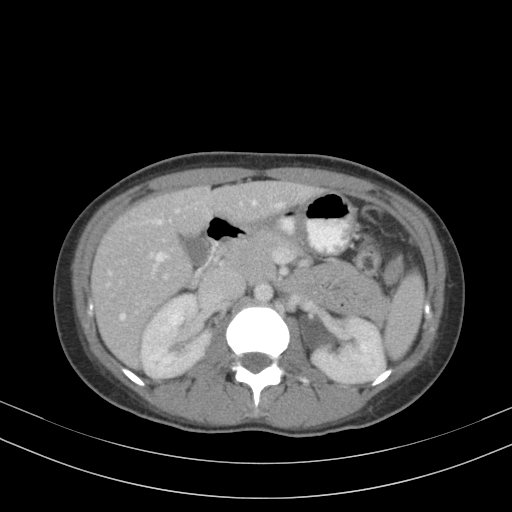
[im 62/78  soft-tissue]
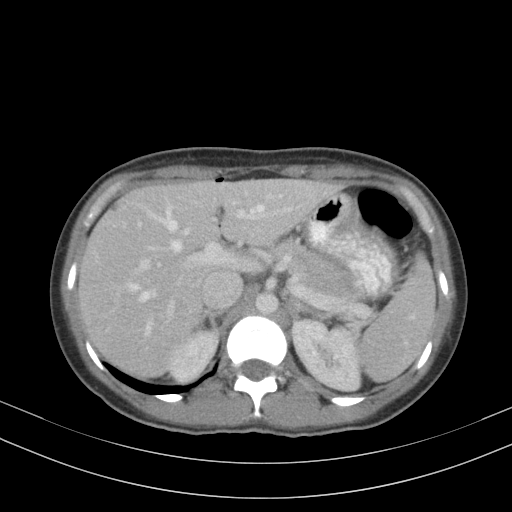
[im 65/78  lung]
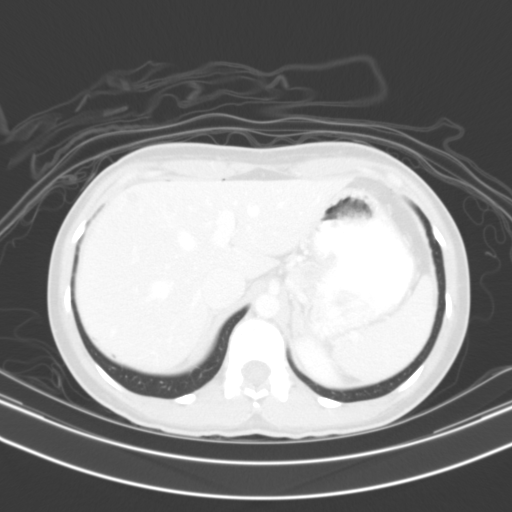
[im 68/78  soft-tissue]
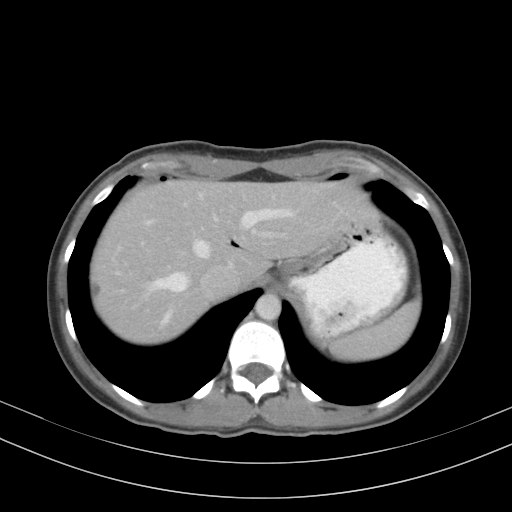
[im 68/78  lung]
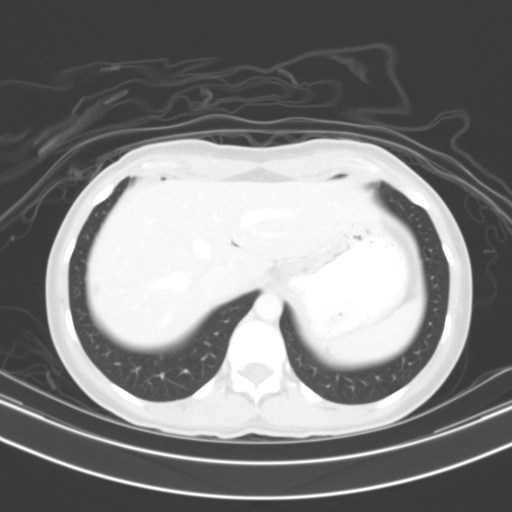
[im 71/78  lung]
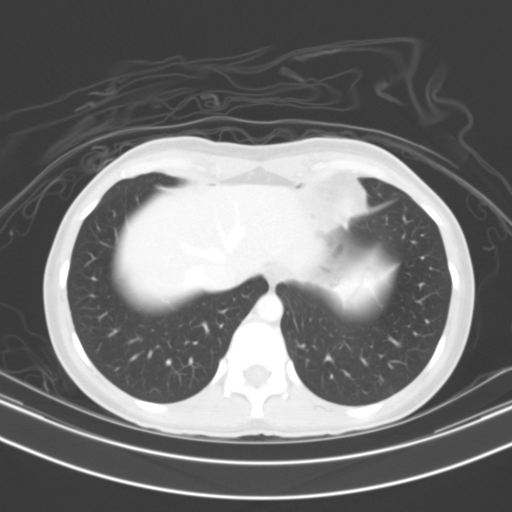
[im 74/78  soft-tissue]
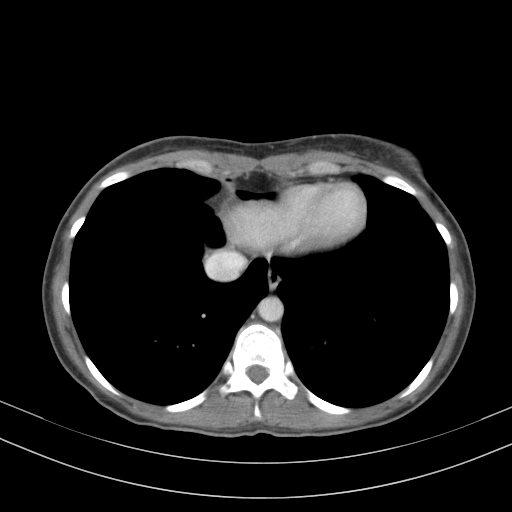
[im 74/78  lung]
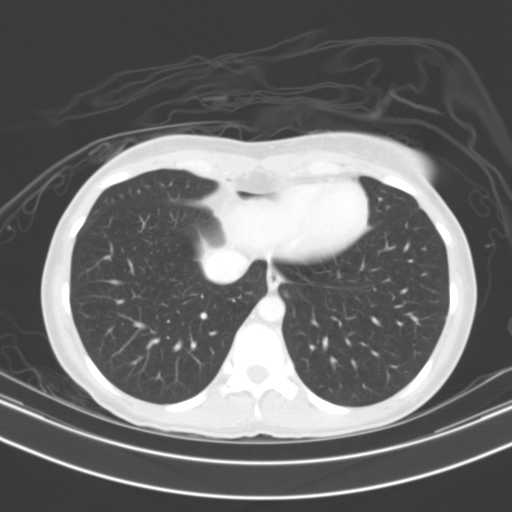

[15 of 32 positions shown; findings below may reference images not displayed]

FINDINGS: The lung bases are clear.  No pericardial or pleural
effusion identified.

There is no focal liver abnormality.  Focal area of low attenuation
within the right hepatic lobe measures 7 mm, image 12/series 2.
Within the anterior right hepatic lobe there is a 7 mm low
attenuation structure, image 16/series 2.

The gallbladder appears normal.  No biliary dilatation.  Normal
appearance of the pancreas.  The spleen appears normal.

Normal appearance of both adrenal glands.  Bilateral
pelvocaliectasis is identified.  No evidence for high-grade
obstructive uropathy.   Gas is noted within the urinary bladder.
The uterus appears edematous.  No focal mass identified.  Cyst
within the right ovary measures 1.7 cm.  Left adnexa is
unremarkable.

The abdominal aorta has a normal caliber.  No aneurysm.  There is
no adenopathy noted within the upper abdomen.  There is no pelvic
or inguinal adenopathy identified.

The stomach appears within normal limits.  The small bowel loops
have a normal caliber without evidence for obstruction.  There is a
normal appearance of the colon.

There is a trace amount of free fluid noted within the dependent
portion of the pelvis.  No focal fluid collections identified.
Small foci of free intraperitoneal air are identified likely
sequela of recent surgery.
IMPRESSION: 1.  There are no specific features identified to explain the
patient's rectal bleeding.
2.  Gas within the urinary bladder likely reflects recent
instrumentation.
3.  Free intraperitoneal air consistent with early postoperative
period.
4.  Right ovarian cyst

## 2022-04-07 ENCOUNTER — Other Ambulatory Visit: Payer: Self-pay

## 2022-04-07 ENCOUNTER — Emergency Department
Admission: EM | Admit: 2022-04-07 | Discharge: 2022-04-07 | Disposition: A | Discharge status: Home / Self Care | Payer: Medicaid Other | Attending: Student in an Organized Health Care Education/Training Program | Admitting: Student in an Organized Health Care Education/Training Program

## 2022-04-07 ENCOUNTER — Emergency Department: Payer: Medicaid Other

## 2022-04-07 DIAGNOSIS — F17200 Nicotine dependence, unspecified, uncomplicated: Secondary | ICD-10-CM | POA: Diagnosis not present

## 2022-04-07 DIAGNOSIS — R079 Chest pain, unspecified: Secondary | ICD-10-CM

## 2022-04-07 DIAGNOSIS — R0789 Other chest pain: Secondary | ICD-10-CM | POA: Diagnosis present

## 2022-04-07 LAB — BASIC METABOLIC PANEL WITH GFR
Anion gap: 6 (ref 5–15)
BUN: 16 mg/dL (ref 6–20)
CO2: 18 mmol/L — ABNORMAL LOW (ref 22–32)
Calcium: 8.9 mg/dL (ref 8.9–10.3)
Chloride: 112 mmol/L — ABNORMAL HIGH (ref 98–111)
Creatinine, Ser: 0.7 mg/dL (ref 0.44–1.00)
GFR, Estimated: >60 mL/min
Glucose, Bld: 109 mg/dL — ABNORMAL HIGH (ref 70–99)
Potassium: 3.4 mmol/L — ABNORMAL LOW (ref 3.5–5.1)
Sodium: 136 mmol/L (ref 135–145)

## 2022-04-07 LAB — POC URINE PREG, ED: Preg Test, Ur: NEGATIVE

## 2022-04-07 LAB — CBC
HCT: 27.9 % — ABNORMAL LOW (ref 36.0–46.0)
Hemoglobin: 8.1 g/dL — ABNORMAL LOW (ref 12.0–15.0)
MCH: 21.8 pg — ABNORMAL LOW (ref 26.0–34.0)
MCHC: 29 g/dL — ABNORMAL LOW (ref 30.0–36.0)
MCV: 75 fL — ABNORMAL LOW (ref 80.0–100.0)
Platelets: 245 10*3/uL (ref 150–400)
RBC: 3.72 MIL/uL — ABNORMAL LOW (ref 3.87–5.11)
RDW: 19.9 % — ABNORMAL HIGH (ref 11.5–15.5)
WBC: 3.5 10*3/uL — ABNORMAL LOW (ref 4.0–10.5)
nRBC: 0 % (ref 0.0–0.2)

## 2022-04-07 LAB — D-DIMER, QUANTITATIVE: D-Dimer, Quant: <0.27 ug{FEU}/mL (ref 0.00–0.50)

## 2022-04-07 LAB — TROPONIN I (HIGH SENSITIVITY)
Troponin I (High Sensitivity): <2 ng/L (ref <18)
Troponin I (High Sensitivity): <2 ng/L (ref <18)

## 2022-04-07 NOTE — ED Triage Notes (Signed)
First Nurse Note;  Pt via EMS from home. Pt c/o SOB and CP that she woke up with this AM. Pt states it is a pressure, non-radiating, substernal pain. Pt has states she has been increasingly stressed. EKG normal per EMS, VS normal per EMS. EMS gave 324 ASA.  Pt is A&Ox4 and NAD 18G L AC

## 2022-04-07 NOTE — ED Provider Notes (Signed)
[facility] Provider Note    Event Date/Time   First MD Initiated Contact with Patient 04/07/22 1111     (approximate)   History   Chest Pain   HPI  Vickie Fields is a 47 y.o. female no significant past medical history is currently on OCP presents to the ER for evaluation of left-sided anterior chest wall pain that awoke her from sleep.  No associated shortness of breath no nausea or vomiting no fevers or chills.  She does occasionally smoke.  No history of COPD or asthma.  No previous history of cardiac disease.  Was given aspirin and now feels well without any pain or discomfort.     Physical Exam   Triage Vital Signs: ED Triage Vitals  Enc Vitals Group     BP 04/07/22 1029 112/73     Pulse Rate 04/07/22 1029 83     Resp 04/07/22 1029 18     Temp 04/07/22 1029 98.1 F (36.7 C)     Temp Source 04/07/22 1029 Oral     SpO2 04/07/22 1029 97 %     Weight --      Height --      Head Circumference --      Peak Flow --      Pain Score 04/07/22 1031 5     Pain Loc --      Pain Edu? --      Excl. in GC? --     Most recent vital signs: Vitals:   04/07/22 1029 04/07/22 1305  BP: 112/73 95/63  Pulse: 83 71  Resp: 18 16  Temp: 98.1 F (36.7 C) 98.4 F (36.9 C)  SpO2: 97% 98%     Constitutional: Alert  Eyes: Conjunctivae are normal.  Head: Atraumatic. Nose: No congestion/rhinnorhea. Mouth/Throat: Mucous membranes are moist.   Neck: Painless ROM.  Cardiovascular:   Good peripheral circulation. Respiratory: Normal respiratory effort.  No retractions.  Gastrointestinal: Soft and nontender.  Musculoskeletal:  no deformity Neurologic:  MAE spontaneously. No gross focal neurologic deficits are appreciated.  Skin:  Skin is warm, dry and intact. No rash noted. Psychiatric: Mood and affect are normal. Speech and behavior are normal.    ED Results / Procedures / Treatments   Labs (all labs ordered are listed, but only abnormal results  are displayed) Labs Reviewed  BASIC METABOLIC PANEL - Abnormal; Notable for the following components:      Result Value   Potassium 3.4 (*)    Chloride 112 (*)    CO2 18 (*)    Glucose, Bld 109 (*)    All other components within normal limits  CBC - Abnormal; Notable for the following components:   WBC 3.5 (*)    RBC 3.72 (*)    Hemoglobin 8.1 (*)    HCT 27.9 (*)    MCV 75.0 (*)    MCH 21.8 (*)    MCHC 29.0 (*)    RDW 19.9 (*)    All other components within normal limits  D-DIMER, QUANTITATIVE  POC URINE PREG, ED  TROPONIN I (HIGH SENSITIVITY)  TROPONIN I (HIGH SENSITIVITY)     EKG  ED ECG REPORT I, Willy Eddy, the attending physician, personally viewed and interpreted this ECG.   Date: 04/07/2022  EKG Time: 10:28  Rate: 75  Rhythm: sinus  Axis: normal  Intervals: normal  ST&T Change: nonspecific st abn, no stemi    RADIOLOGY Please see ED Course for my review and interpretation.  I personally reviewed all radiographic images ordered to evaluate for the above acute complaints and reviewed radiology reports and findings.  These findings were personally discussed with the patient.  Please see medical record for radiology report.    PROCEDURES:  Critical Care performed: No  Procedures   MEDICATIONS ORDERED IN ED: Medications - No data to display   IMPRESSION / MDM / [place] / ED COURSE  I reviewed the triage vital signs and the nursing notes.                              Differential diagnosis includes, but is not limited to, ACS, pericarditis, esophagitis, boerhaaves, pe, dissection, pna, bronchitis, costochondritis  Patient presenting to the ER for evaluation of symptoms as described above.  Based on symptoms, risk factors and considered above differential, this presenting complaint could reflect a potentially life-threatening illness therefore the patient will be placed on continuous pulse oximetry and telemetry for monitoring.   Laboratory evaluation will be sent to evaluate for the above complaints.  Based on history and physical send low risk for ACS will observe for delta enzymes as the initial was negative.  She is low risk by Wells criteria will order D-dimer to further rule stratify.  Chest x-ray on my review and interpretation does not show any evidence of consolidation or infiltrate.    Clinical Course as of 04/07/22 1332  Sat Apr 07, 2022  1331 Patient reassessed remains pain-free.  Repeat troponin negative.  D-dimer negative.  Given her reassuring workup and well appearance does appear stable and appropriate for outpatient follow-up. [PR]    Clinical Course User Index [PR] Merlyn Lot, MD     FINAL CLINICAL IMPRESSION(S) / ED DIAGNOSES   Final diagnoses:  Chest pain, unspecified type     Rx / DC Orders   ED Discharge Orders     None        Note:  This document was prepared using Dragon voice recognition software and may include unintentional dictation errors.    Merlyn Lot, MD 04/07/22 1332

## 2022-08-17 ENCOUNTER — Encounter: Payer: Self-pay | Admitting: Emergency Medicine

## 2022-08-17 ENCOUNTER — Telehealth: Payer: Self-pay | Admitting: Podiatry

## 2022-08-17 ENCOUNTER — Emergency Department
Admission: EM | Admit: 2022-08-17 | Discharge: 2022-08-17 | Disposition: A | Discharge status: Home / Self Care | Payer: Medicaid Other | Attending: Student in an Organized Health Care Education/Training Program | Admitting: Student in an Organized Health Care Education/Training Program

## 2022-08-17 ENCOUNTER — Emergency Department: Payer: Medicaid Other

## 2022-08-17 ENCOUNTER — Other Ambulatory Visit: Payer: Self-pay

## 2022-08-17 DIAGNOSIS — S92352A Displaced fracture of fifth metatarsal bone, left foot, initial encounter for closed fracture: Secondary | ICD-10-CM | POA: Diagnosis not present

## 2022-08-17 DIAGNOSIS — W1839XA Other fall on same level, initial encounter: Secondary | ICD-10-CM | POA: Diagnosis not present

## 2022-08-17 DIAGNOSIS — S99922A Unspecified injury of left foot, initial encounter: Secondary | ICD-10-CM | POA: Diagnosis present

## 2022-08-17 MED ORDER — OXYCODONE-ACETAMINOPHEN 5-325 MG PO TABS: 1.0000 | ORAL_TABLET | ORAL | 0 refills | Status: AC | PRN

## 2022-08-17 MED ORDER — OXYCODONE-ACETAMINOPHEN 5-325 MG PO TABS
1.0000 | ORAL_TABLET | Freq: Once | ORAL | Status: AC
  Administered 2022-08-17: 1 via ORAL
  Filled 2022-08-17: qty 1

## 2022-08-17 NOTE — ED Provider Notes (Signed)
[facility] Provider Note    Event Date/Time   First MD Initiated Contact with Patient 08/17/22 [phone]     (approximate)   History   Foot Pain   HPI  Vickie Fields is a 47 y.o. female who was getting ready for work this morning standing on a tote which given causing her to fall landing awkwardly on her left foot causing severe left foot pain.  Does have some bruising and swelling to the midfoot.  No other associated injury.     Physical Exam   Triage Vital Signs: ED Triage Vitals  Enc Vitals Group     BP 08/17/22 0310 122/77     Pulse Rate 08/17/22 0310 89     Resp 08/17/22 0310 20     Temp 08/17/22 0310 97.9 F (36.6 C)     Temp Source 08/17/22 0310 Oral     SpO2 08/17/22 0310 99 %     Weight 08/17/22 0259 120 lb (54.4 kg)     Height 08/17/22 0259 5\' 2"  (1.575 m)     Head Circumference --      Peak Flow --      Pain Score 08/17/22 0259 10     Pain Loc --      Pain Edu? --      Excl. in GC? --     Most recent vital signs: Vitals:   08/17/22 0310  BP: 122/77  Pulse: 89  Resp: 20  Temp: 97.9 F (36.6 C)  SpO2: 99%     Constitutional: Alert  Eyes: Conjunctivae are normal.  Head: Atraumatic. Nose: No congestion/rhinnorhea. Mouth/Throat: Mucous membranes are moist.   Neck: Painless ROM.  Cardiovascular:   Good peripheral circulation. Respiratory: Normal respiratory effort.  No retractions.  Gastrointestinal: Soft and nontender.  Musculoskeletal: Bruising and swelling over the lateral left foot.  Neurovascular intact distally.  No proximal pain Neurologic:  MAE spontaneously. No gross focal neurologic deficits are appreciated.  Skin:  Skin is warm, dry and intact. No rash noted. Psychiatric: Mood and affect are normal. Speech and behavior are normal.    ED Results / Procedures / Treatments   Labs (all labs ordered are listed, but only abnormal results are displayed) Labs Reviewed - No data to  display   EKG     RADIOLOGY Please see ED Course for my review and interpretation.  I personally reviewed all radiographic images ordered to evaluate for the above acute complaints and reviewed radiology reports and findings.  These findings were personally discussed with the patient.  Please see medical record for radiology report.    PROCEDURES:  Critical Care performed:   Procedures   MEDICATIONS ORDERED IN ED: Medications  oxyCODONE-acetaminophen (PERCOCET/ROXICET) 5-325 MG per tablet 1 tablet (has no administration in time range)     IMPRESSION / MDM / ASSESSMENT AND PLAN / ED COURSE  I reviewed the triage vital signs and the nursing notes.                              Differential diagnosis includes, but is not limited to, fracture, contusion, ligamentous injury, dislocation  Patient presented to the ER for evaluation of left foot pain as described above.  X-ray ordered for the above differential shows evidence of fracture.  Patient given Percocet for pain.  Placed in fracture shoe.  Given crutches.  Discussed need for close outpatient follow-up with podiatry.   Clinical Course  as of 08/17/22 0434  Fri Aug 17, 2022  0424 X-ray my review and interpretation with evidence of metatarsal fracture of the fifth metatarsal. [PR]    Clinical Course User Index [PR] Willy Eddy, MD     FINAL CLINICAL IMPRESSION(S) / ED DIAGNOSES   Final diagnoses:  Closed displaced fracture of fifth metatarsal bone of left foot, initial encounter     Rx / DC Orders   ED Discharge Orders          Ordered    oxyCODONE-acetaminophen (PERCOCET) 5-325 MG tablet  Every 4 hours PRN        08/17/22 0432             Note:  This document was prepared using Dragon voice recognition software and may include unintentional dictation errors.    Willy Eddy, MD 08/17/22 [phone]

## 2022-08-17 NOTE — Telephone Encounter (Signed)
Left message for pt to call the office to get scheduled per [facility] ED and Dr Allena Katz

## 2022-08-17 NOTE — ED Triage Notes (Signed)
Patient ambulatory to triage with steady gait, without difficulty or distress noted; pt reports PTA she was getting ready for work, standing on a tote and it caved in; c/o pain to left foot; no swelling or deformity noted

## 2022-08-26 ENCOUNTER — Emergency Department: Payer: Medicaid Other

## 2022-08-26 ENCOUNTER — Emergency Department
Admission: EM | Admit: 2022-08-26 | Discharge: 2022-08-26 | Disposition: A | Discharge status: Home / Self Care | Payer: Medicaid Other | Attending: Emergency Medicine | Admitting: Emergency Medicine

## 2022-08-26 ENCOUNTER — Other Ambulatory Visit: Payer: Self-pay

## 2022-08-26 DIAGNOSIS — M25561 Pain in right knee: Secondary | ICD-10-CM | POA: Diagnosis present

## 2022-08-26 MED ORDER — KETOROLAC TROMETHAMINE 15 MG/ML IJ SOLN
15.0000 mg | Freq: Once | INTRAMUSCULAR | Status: AC
  Administered 2022-08-26: 15 mg via INTRAMUSCULAR
  Filled 2022-08-26: qty 1

## 2022-08-26 MED ORDER — NAPROXEN 500 MG PO TABS: 500.0000 mg | ORAL_TABLET | Freq: Two times a day (BID) | ORAL | 0 refills | Status: AC

## 2022-08-26 MED ORDER — ACETAMINOPHEN 325 MG PO TABS
650.0000 mg | ORAL_TABLET | Freq: Once | ORAL | Status: AC
  Administered 2022-08-26: 650 mg via ORAL
  Filled 2022-08-26: qty 2

## 2022-08-26 NOTE — Discharge Instructions (Signed)
Please follow-up with the podiatrist as you have scheduled on Tuesday.  You may wear the Ace wrap for your knee, please remove it at night.  You may also take the medication as prescribed to help with your symptoms.  Do not take with other NSAIDs.  Return for any new, worsening, or change in symptoms or other concerns.  It was a pleasure caring for you today.

## 2022-08-26 NOTE — ED Provider Notes (Signed)
[facility] Provider Note    Event Date/Time   First MD Initiated Contact with Patient 08/26/22 0930     (approximate)   History   Knee Pain   HPI  Vickie Fields is a 47 y.o. female who presents today for evaluation of right knee pain.  Patient reports that she broke her left foot and has been using crutches to ambulate.  She feels that she has been walking differently because of her recent foot fracture.  She denies any specific injury to her knee.  She reports that she is able to ambulate.  Patient Active Problem List   Diagnosis Date Noted   History of bilateral salpingectomy 09/11/2012   Constipation 08/12/2012   Hemorrhoid 08/12/2012          Physical Exam   Triage Vital Signs: ED Triage Vitals  Enc Vitals Group     BP 08/26/22 0927 109/72     Pulse Rate 08/26/22 0927 91     Resp 08/26/22 0927 18     Temp 08/26/22 0927 98.7 F (37.1 C)     Temp src --      SpO2 08/26/22 0927 100 %     Weight --      Height --      Head Circumference --      Peak Flow --      Pain Score 08/26/22 0926 7     Pain Loc --      Pain Edu? --      Excl. in GC? --     Most recent vital signs: Vitals:   08/26/22 0927  BP: 109/72  Pulse: 91  Resp: 18  Temp: 98.7 F (37.1 C)  SpO2: 100%    Physical Exam Vitals and nursing note reviewed.  Constitutional:      General: Awake and alert. No acute distress.    Appearance: Normal appearance. The patient is normal weight.  HENT:     Head: Normocephalic and atraumatic.     Mouth: Mucous membranes are moist.  Eyes:     General: PERRL. Normal EOMs        Right eye: No discharge.        Left eye: No discharge.     Conjunctiva/sclera: Conjunctivae normal.  Cardiovascular:     Rate and Rhythm: Normal rate and regular rhythm.     Pulses: Normal pulses.     Heart sounds: Normal heart sounds Pulmonary:     Effort: Pulmonary effort is normal. No respiratory distress.     Breath sounds: Normal  breath sounds.  Abdominal:     Abdomen is soft. There is no abdominal tenderness. No rebound or guarding. No distention. Musculoskeletal:        General: No swelling. Normal range of motion.     Cervical back: Normal range of motion and neck supple.  Right knee: No deformity or rash.  Mild superior joint line tenderness. No patellar tenderness, no ballotment Warm and well perfused extremity with 2+ pedal pulses 5/5 strength to dorsiflexion and plantarflexion at the ankle with intact sensation throughout extremity Normal range of motion of the knee, with intact flexion and extension to active and passive range of motion. Extensor mechanism intact. No ligamentous laxity. Negative anterior/posterior drawer/negative lachman, negative mcmurrays No effusion or warmth Intact quadriceps, hamstring function, patellar tendon function Pelvis stable Full ROM of ankle without pain or swelling Foot warm and well perfused Left foot with faint ecchymosis to the lateral aspect of  her foot.  Normal 2+ pedal pulse.  No obvious swelling.  Sensation intact light touch throughout.  No open wounds. Skin:    General: Skin is warm and dry.     Capillary Refill: Capillary refill takes less than 2 seconds.     Findings: No rash.  Neurological:     Mental Status: The patient is awake and alert.      ED Results / Procedures / Treatments   Labs (all labs ordered are listed, but only abnormal results are displayed) Labs Reviewed - No data to display   EKG     RADIOLOGY I independently reviewed and interpreted imaging and agree with radiologists findings.     PROCEDURES:  Critical Care performed:   Procedures   MEDICATIONS ORDERED IN ED: Medications  ketorolac (TORADOL) 15 MG/ML injection 15 mg (15 mg Intramuscular Given 08/26/22 0943)  acetaminophen (TYLENOL) tablet 650 mg (650 mg Oral Given 08/26/22 0943)     IMPRESSION / MDM / ASSESSMENT AND PLAN / ED COURSE  I reviewed the triage vital  signs and the nursing notes.   Differential diagnosis includes, but is not limited to, sprain, contusion, ligamental injury, unlikely fracture.  I reviewed the patient's chart.  She was seen on 08/17/2022 for evaluation of left foot pain and was found to have a fifth digit metatarsal fracture.  Patient presents the emergency department awake and alert, hemodynamically stable and afebrile. No evidence of neurological deficit or vascular compromise on exam. No fracture/dislocation on X-Ray. No deformity or obvious ligamentous laxity on exam.No constitutional symptoms or effusion to suggest septic joint. No history of immunosuppression. Overall well appearing, vital signs stable. No indication for diagnostic or therapeutic procedure such as arthrocentesis.  I suspect knee strain from her change in her gait with wearing the boot for her foot fracture.  Patient has follow-up on Tuesday and I recommended that she keep this appointment.  Her foot appears to be healing well.  Return precautions and care instructions discussed.  She was given naproxen for pain control, she reports that she was given Percocet before and this made her vomit and so she does not want to take this anymore.  She denies any chance of pregnancy and does not wish to test for pregnancy.  She understands the risks of taking NSAIDs if she is pregnant and she denies any concern for being pregnant.  Patient agrees with plan of care.  She was discharged in stable condition.    Patient's presentation is most consistent with acute complicated illness / injury requiring diagnostic workup.    FINAL CLINICAL IMPRESSION(S) / ED DIAGNOSES   Final diagnoses:  Acute pain of right knee     Rx / DC Orders   ED Discharge Orders          Ordered    naproxen (NAPROSYN) 500 MG tablet  2 times daily with meals        08/26/22 1020             Note:  This document was prepared using Dragon voice recognition software and may include  unintentional dictation errors.   Keturah Shavers 08/26/22 1306    Georga Hacking, MD 08/26/22 1350

## 2022-08-26 NOTE — ED Triage Notes (Signed)
Pt comes wih c/o right knee pain that started last night. Pt states she broke her left foot and has been on crutches. Pt doesn't know if she is putting too much weight on it or what. Pt states pain is 7/10.

## 2022-08-28 ENCOUNTER — Encounter: Payer: Self-pay | Admitting: Podiatry

## 2022-08-28 ENCOUNTER — Ambulatory Visit: Payer: Medicaid Other | Admitting: Podiatry

## 2022-08-28 ENCOUNTER — Ambulatory Visit (INDEPENDENT_AMBULATORY_CARE_PROVIDER_SITE_OTHER): Payer: Medicaid Other

## 2022-08-28 DIAGNOSIS — M79672 Pain in left foot: Secondary | ICD-10-CM

## 2022-08-28 DIAGNOSIS — S92352A Displaced fracture of fifth metatarsal bone, left foot, initial encounter for closed fracture: Secondary | ICD-10-CM | POA: Diagnosis not present

## 2022-08-28 NOTE — Progress Notes (Signed)
   Chief Complaint  Patient presents with   Fracture    Patient had a fall on 08/14/2022, pain and swelling in the 5th toe, seen at the ED given surgical shoe and pain meds    HPI: 47 y.o. female presenting today as a new patient for evaluation of a fracture to the fifth metatarsal of the left foot.  Patient states that about 1 week ago she was getting ready for work when she fell at her home.  She went to the ED and was diagnosed with a fracture and referred here.  She is currently nonweightbearing in a postsurgical shoe with crutches.  Past Medical History:  Diagnosis Date   Constipation    Ulcer mouth     Past Surgical History:  Procedure Laterality Date   ECTOPIC PREGNANCY SURGERY     LAPAROSCOPIC LYSIS OF ADHESIONS N/A 08/11/2012   Procedure: LAPAROSCOPIC LYSIS OF ADHESIONS;  Surgeon: Lesly Dukes, MD;  Location: [facility];  Service: Gynecology;  Laterality: N/A;   LAPAROSCOPIC UNILATERAL SALPINGECTOMY Left 08/11/2012   Procedure: LAPAROSCOPIC UNILATERAL SALPINGECTOMY;  Surgeon: Lesly Dukes, MD;  Location: [facility];  Service: Gynecology;  Laterality: Left;   LAPAROSCOPY N/A 08/11/2012   Procedure: LAPAROSCOPY OPERATIVE;  Surgeon: Lesly Dukes, MD;  Location: [facility];  Service: Gynecology;  Laterality: N/A;   TUBAL LIGATION      Allergies  Allergen Reactions   Reglan [Metoclopramide]     "paranoid"     Physical Exam: General: The patient is alert and oriented x3 in no acute distress.  Dermatology: Skin is warm, dry and supple bilateral lower extremities.   Vascular: Palpable pedal pulses bilaterally. Capillary refill within normal limits.  Ecchymosis with edema noted left foot  Neurological: Grossly intact via light touch  Musculoskeletal Exam: No pedal deformities noted  Radiographic Exam LT foot 08/28/2022:  Normal osseous mineralization.  Extra-articular fracture noted mildly displaced to the diaphysis of the fifth metatarsal left foot.  Overall it is in gross  alignment.  Assessment/Plan of Care: 1.  Mildly displaced fracture fifth metatarsal left foot  -Patient evaluated.  X-rays reviewed -Overall the fifth metatarsal is in decent alignment.  At the moment I do not see any benefit for surgery and this should heal uneventfully if the patient is able to be nonweightbearing and allow the foot to heal -Strict nonweightbearing in the surgical shoe with the assistance of crutches x 8 weeks -RICE -Note for work was provided today.  No work x 8 weeks. -Return to clinic 8 weeks follow-up x-ray  *Just started a job at [employer]     Felecia Shelling, DPM [facility]  Dr. Felecia Shelling, DPM    [address] [place], [place] [address]                Office [phone]  Fax [phone]

## 2022-10-23 ENCOUNTER — Ambulatory Visit: Payer: Medicaid Other | Admitting: Podiatry

## 2022-11-13 ENCOUNTER — Ambulatory Visit: Payer: Medicaid Other | Admitting: Podiatry

## 2022-11-20 ENCOUNTER — Encounter: Payer: Self-pay | Admitting: Podiatry

## 2022-11-20 ENCOUNTER — Ambulatory Visit: Payer: Medicaid Other | Admitting: Podiatry

## 2022-11-20 ENCOUNTER — Ambulatory Visit (INDEPENDENT_AMBULATORY_CARE_PROVIDER_SITE_OTHER): Payer: Medicaid Other

## 2022-11-20 DIAGNOSIS — S92352A Displaced fracture of fifth metatarsal bone, left foot, initial encounter for closed fracture: Secondary | ICD-10-CM

## 2022-11-20 NOTE — Progress Notes (Signed)
   Chief Complaint  Patient presents with   Fracture    "It has it's days.  It swells up all the time.  I tried to put a boot on the other day and I couldn't."    HPI: 47 y.o. female presenting today for follow-up evaluation of a fracture to the fifth metatarsal of the left foot.  Overall improvement.  She is still unable to wear regular tennis shoes.   Past Medical History:  Diagnosis Date   Constipation    Ulcer mouth     Past Surgical History:  Procedure Laterality Date   ECTOPIC PREGNANCY SURGERY     LAPAROSCOPIC LYSIS OF ADHESIONS N/A 08/11/2012   Procedure: LAPAROSCOPIC LYSIS OF ADHESIONS;  Surgeon: Lesly Dukes, MD;  Location: [facility];  Service: Gynecology;  Laterality: N/A;   LAPAROSCOPIC UNILATERAL SALPINGECTOMY Left 08/11/2012   Procedure: LAPAROSCOPIC UNILATERAL SALPINGECTOMY;  Surgeon: Lesly Dukes, MD;  Location: [facility];  Service: Gynecology;  Laterality: Left;   LAPAROSCOPY N/A 08/11/2012   Procedure: LAPAROSCOPY OPERATIVE;  Surgeon: Lesly Dukes, MD;  Location: [facility];  Service: Gynecology;  Laterality: N/A;   TUBAL LIGATION      Allergies  Allergen Reactions   Reglan [Metoclopramide]     "paranoid"     Physical Exam: General: The patient is alert and oriented x3 in no acute distress.  Dermatology: Skin is warm, dry and supple bilateral lower extremities.   Vascular: Palpable pedal pulses bilaterally. Capillary refill within normal limits.  Ecchymosis with edema noted left foot  Neurological: Grossly intact via light touch  Musculoskeletal Exam: No pedal deformities noted.  There continues to be some slight tenderness with palpation  Radiographic Exam LT foot 08/28/2022:  Normal osseous mineralization.  Extra-articular fracture noted mildly displaced to the diaphysis of the fifth metatarsal left foot.  Overall it is in gross alignment.  Assessment/Plan of Care: 1.  Mildly displaced fracture fifth metatarsal left foot  -Patient evaluated.  X-rays  reviewed again today and compared to prior x-rays -Continue minimal WBAT in the cam boot -Continue to refrain from work until next appointment -Return to clinic 6 weeks   *Just started a job at [employer]     Felecia Shelling, DPM [facility]  Dr. Felecia Shelling, DPM    [address] [place], [place] [address]                Office [phone]  Fax [phone]

## 2022-11-22 ENCOUNTER — Other Ambulatory Visit: Payer: Self-pay | Admitting: Podiatry

## 2022-11-22 DIAGNOSIS — S92352A Displaced fracture of fifth metatarsal bone, left foot, initial encounter for closed fracture: Secondary | ICD-10-CM

## 2022-11-22 DIAGNOSIS — M79672 Pain in left foot: Secondary | ICD-10-CM

## 2023-01-22 ENCOUNTER — Ambulatory Visit: Payer: Medicaid Other | Admitting: Podiatry

## 2023-01-25 ENCOUNTER — Ambulatory Visit (INDEPENDENT_AMBULATORY_CARE_PROVIDER_SITE_OTHER): Payer: Medicaid Other

## 2023-01-25 ENCOUNTER — Ambulatory Visit: Payer: Medicaid Other | Admitting: Podiatry

## 2023-01-25 ENCOUNTER — Encounter: Payer: Self-pay | Admitting: Podiatry

## 2023-01-25 VITALS — BP 115/79 | HR 85

## 2023-01-25 DIAGNOSIS — S92352A Displaced fracture of fifth metatarsal bone, left foot, initial encounter for closed fracture: Secondary | ICD-10-CM

## 2023-01-25 NOTE — Progress Notes (Signed)
   No chief complaint on file.   HPI: 47 y.o. female presenting today for follow-up evaluation of a fracture to the fifth metatarsal of the left foot.  Overall improvement.  She is still unable to wear regular tennis shoes.   Past Medical History:  Diagnosis Date   Constipation    Ulcer mouth     Past Surgical History:  Procedure Laterality Date   ECTOPIC PREGNANCY SURGERY     LAPAROSCOPIC LYSIS OF ADHESIONS N/A 08/11/2012   Procedure: LAPAROSCOPIC LYSIS OF ADHESIONS;  Surgeon: Lesly Dukes, MD;  Location: [facility];  Service: Gynecology;  Laterality: N/A;   LAPAROSCOPIC UNILATERAL SALPINGECTOMY Left 08/11/2012   Procedure: LAPAROSCOPIC UNILATERAL SALPINGECTOMY;  Surgeon: Lesly Dukes, MD;  Location: [facility];  Service: Gynecology;  Laterality: Left;   LAPAROSCOPY N/A 08/11/2012   Procedure: LAPAROSCOPY OPERATIVE;  Surgeon: Lesly Dukes, MD;  Location: [facility];  Service: Gynecology;  Laterality: N/A;   TUBAL LIGATION      Allergies  Allergen Reactions   Reglan [Metoclopramide]     "paranoid"     Physical Exam: General: The patient is alert and oriented x3 in no acute distress.  Dermatology: Skin is warm, dry and supple bilateral lower extremities.   Vascular: Palpable pedal pulses bilaterally. Capillary refill within normal limits.  Ecchymosis with edema noted left foot  Neurological: Grossly intact via light touch  Musculoskeletal Exam: No pedal deformities noted.  There continues to be some slight tenderness with palpation  Radiographic Exam LT foot 01/25/2023:  Normal osseous mineralization.  Extra-articular fracture noted mildly displaced to the diaphysis of the fifth metatarsal left foot.  There continues to demonstrate routine healing  Assessment/Plan of Care: 1.  Mildly displaced fracture fifth metatarsal left foot. DOI: 08/14/22  -Patient evaluated.  X-rays reviewed again today and compared to prior x-rays - Patient has now been about 5 months in the cam boot.   She may slowly transition out of the cam boot to supportive tennis shoes and sneakers.  Refrain from going barefoot.  Explained that if she is on her feet for prolonged period of time to wear the cam boot. -Return to clinic 8 weeks follow-up x-ray   Felecia Shelling, DPM [facility]  Dr. Felecia Shelling, DPM    [address] [place], [place] [address]                Office [phone]  Fax [phone]
